# Patient Record
Sex: Female | Born: 1960 | Race: Black or African American | Hispanic: No | Smoking: Former smoker
Health system: Southern US, Community
[De-identification: ages and names within clinical notes are randomized; demographics above are authoritative.]

## PROBLEM LIST (undated history)

## (undated) DIAGNOSIS — U071 COVID-19: Secondary | ICD-10-CM

## (undated) DIAGNOSIS — D259 Leiomyoma of uterus, unspecified: Secondary | ICD-10-CM

## (undated) HISTORY — PX: OTHER SURGICAL HISTORY: SHX169

---

## 2013-05-07 ENCOUNTER — Ambulatory Visit: Payer: Self-pay

## 2013-05-19 ENCOUNTER — Ambulatory Visit: Payer: Self-pay

## 2013-05-20 LAB — PATHOLOGY REPORT

## 2013-10-31 ENCOUNTER — Ambulatory Visit: Payer: Self-pay

## 2014-08-26 ENCOUNTER — Ambulatory Visit: Payer: Self-pay | Admitting: Family Medicine

## 2018-04-30 ENCOUNTER — Other Ambulatory Visit: Payer: Self-pay

## 2018-04-30 ENCOUNTER — Emergency Department: Payer: Managed Care, Other (non HMO)

## 2018-04-30 ENCOUNTER — Emergency Department
Admission: EM | Admit: 2018-04-30 | Discharge: 2018-04-30 | Disposition: A | Discharge status: Home / Self Care | Payer: Managed Care, Other (non HMO) | Attending: Emergency Medicine | Admitting: Emergency Medicine

## 2018-04-30 ENCOUNTER — Encounter: Payer: Self-pay | Admitting: Emergency Medicine

## 2018-04-30 DIAGNOSIS — S86912A Strain of unspecified muscle(s) and tendon(s) at lower leg level, left leg, initial encounter: Secondary | ICD-10-CM

## 2018-04-30 DIAGNOSIS — Y929 Unspecified place or not applicable: Secondary | ICD-10-CM | POA: Insufficient documentation

## 2018-04-30 DIAGNOSIS — Y939 Activity, unspecified: Secondary | ICD-10-CM | POA: Insufficient documentation

## 2018-04-30 DIAGNOSIS — M25462 Effusion, left knee: Secondary | ICD-10-CM | POA: Insufficient documentation

## 2018-04-30 DIAGNOSIS — W541XXA Struck by dog, initial encounter: Secondary | ICD-10-CM | POA: Diagnosis not present

## 2018-04-30 DIAGNOSIS — S8392XA Sprain of unspecified site of left knee, initial encounter: Secondary | ICD-10-CM | POA: Diagnosis not present

## 2018-04-30 DIAGNOSIS — Z87891 Personal history of nicotine dependence: Secondary | ICD-10-CM | POA: Diagnosis not present

## 2018-04-30 DIAGNOSIS — S8992XA Unspecified injury of left lower leg, initial encounter: Secondary | ICD-10-CM | POA: Diagnosis present

## 2018-04-30 DIAGNOSIS — Y998 Other external cause status: Secondary | ICD-10-CM | POA: Insufficient documentation

## 2018-04-30 MED ORDER — TRAMADOL HCL 50 MG PO TABS
50.0000 mg | ORAL_TABLET | Freq: Once | ORAL | Status: AC
  Administered 2018-04-30: 50 mg via ORAL

## 2018-04-30 MED ORDER — HYDROCODONE-ACETAMINOPHEN 5-325 MG PO TABS
1.0000 | ORAL_TABLET | Freq: Once | ORAL | Status: DC
  Filled 2018-04-30: qty 1

## 2018-04-30 MED ORDER — TRAMADOL HCL 50 MG PO TABS
ORAL_TABLET | ORAL | Status: AC
  Filled 2018-04-30: qty 1

## 2018-04-30 MED ORDER — TRAMADOL HCL 50 MG PO TABS: 50.0000 mg | ORAL_TABLET | Freq: Two times a day (BID) | ORAL | 0 refills | Status: AC | PRN

## 2018-04-30 MED ORDER — DICLOFENAC SODIUM 50 MG PO TBEC: 50.0000 mg | DELAYED_RELEASE_TABLET | Freq: Two times a day (BID) | ORAL | 0 refills | Status: AC

## 2018-04-30 MED ORDER — CYCLOBENZAPRINE HCL 5 MG PO TABS: 5.0000 mg | ORAL_TABLET | Freq: Three times a day (TID) | ORAL | 0 refills | Status: DC | PRN

## 2018-04-30 NOTE — ED Notes (Signed)
Pt c/o left knee pain after dog jumped on her and caused her to fall. Left knee is visibly swollen, but no obvious deformities noted at this time.

## 2018-04-30 NOTE — ED Triage Notes (Signed)
PT arrived after mechanical injury causing pt to fall and injure left leg/knee. No obvious deformity

## 2018-04-30 NOTE — ED Notes (Signed)
X-ray at bedside

## 2018-04-30 NOTE — ED Provider Notes (Signed)
[facility] Emergency Department Provider Note ____________________________________________  Time seen: 40  I have reviewed the triage vital signs and the nursing notes.  HISTORY  Chief Complaint  Leg Pain  HPI Mariah Jefferson is a 57 y.o. female presents herself to the ED for evaluation of injury sustained following mechanical fall.  Patient describes her large breed [place] dog, apparently knocked her over at home this afternoon.  The dog was brought into the house, came up behind the patient, clipping her behind her legs causing her to fall backwards on her buttocks.  She describes falling onto the buttocks with the left leg flexed at the knee.  History reviewed. No pertinent past medical history.  There are no active problems to display for this patient.  History reviewed. No pertinent surgical history.  Prior to Admission medications   Medication Sig Start Date End Date Taking? Authorizing Provider  cyclobenzaprine (FLEXERIL) 5 MG tablet Take 1 tablet (5 mg total) by mouth 3 (three) times daily as needed for muscle spasms. 04/30/18   Caileb Rhue, Dannielle Karvonen, PA-C  diclofenac (VOLTAREN) 50 MG EC tablet Take 1 tablet (50 mg total) by mouth 2 (two) times daily for 15 days. 04/30/18 05/15/18  Shamaria Kavan, Dannielle Karvonen, PA-C  traMADol (ULTRAM) 50 MG tablet Take 1 tablet (50 mg total) by mouth 2 (two) times daily as needed for up to 3 days. 04/30/18 05/03/18  Murry Khiev, Dannielle Karvonen, PA-C   Allergies Penicillins  No family history on file.  Social History Social History   Tobacco Use  . Smoking status: Former Research scientist (life sciences)  . Smokeless tobacco: Never Used  Substance Use Topics  . Alcohol use: Never    Frequency: Never  . Drug use: Never    Review of Systems  Constitutional: Negative for fever. Eyes: Negative for visual changes. ENT: Negative for sore throat. Cardiovascular: Negative for chest pain. Respiratory: Negative for shortness of  breath. Gastrointestinal: Negative for abdominal pain, vomiting and diarrhea. Genitourinary: Negative for dysuria. Musculoskeletal: Negative for back pain. Left knee/leg pain as above.  Skin: Negative for rash. Neurological: Negative for headaches, focal weakness or numbness. ____________________________________________  PHYSICAL EXAM:  VITAL SIGNS: ED Triage Vitals [04/30/18 1751]  Enc Vitals Group     BP 135/78     Pulse Rate 84     Resp 18     Temp 98.3 F (36.8 C)     Temp Source Oral     SpO2 100 %     Weight 178 lb (80.7 kg)     Height 5\' 7"  (1.702 m)     Head Circumference      Peak Flow      Pain Score 10     Pain Loc      Pain Edu?      Excl. in [place]?     Constitutional: Alert and oriented. Well appearing and in no distress. Head: Normocephalic and atraumatic. Eyes: Conjunctivae are normal. Normal extraocular movements Cardiovascular: Normal rate, regular rhythm. Normal distal pulses. Respiratory: Normal respiratory effort. No wheezes/rales/rhonchi. Musculoskeletal: Left knee with moderate joint effusion noted. No obvious deformity or dislocation. Normal patella tracking with ballotment. No valgus or varus joint laxity. Patient is tender to palp to the lateral hamstring insertion. No popliteal space fullness or achilles tenderness.  She has some mild soft tissue swelling noted to the lateral aspect of the proximal calf without abrasion or laceration.  Nontender with normal range of motion in all other extremities.  Neurologic:  CN II-XII grossly intact. Normal speech and language. No gross focal neurologic deficits are appreciated. Skin:  Skin is warm, dry and intact. No rash noted. Psychiatric: Mood and affect are normal. Patient exhibits appropriate insight and judgment. ____________________________________________   RADIOLOGY  Left Knee IMPRESSION: Moderate suprapatellar joint effusion. No acute displaced fracture. Equivocal tiny avulsion along the course of the  LCL but more likely represents partial imaging of a small osteophyte off the lateral tibial plateau.  Left Tibia/Fibula IMPRESSION: No acute fracture. ____________________________________________  PROCEDURES  Procedures Ultram 50 mg PO Knee immobilizer ____________________________________________  INITIAL IMPRESSION / ASSESSMENT AND PLAN / ED COURSE  Patient with ED evaluation of injury sustained following a mechanical fall.  She is reassured by her x-rays of the knee and lower leg.  Symptoms likely represent a sprain with a moderate effusion without underlying fracture or dislocation.  Patient will be placed in a knee immobilizer for support to ambulate with weightbearing as tolerated.  She is also referred to orthopedics for ongoing symptom management.  Prescriptions for Flexeril, diclofenac, and #6 Ultram were provided for pain and inflammation relief.  A work note is provided for today as requested.  I reviewed the patient's prescription history over the last 12 months in the multi-state controlled substances database(s) that includes [place], [place], [place], [place], [place], [place], [place], [place], [place], [place], [place], [place], [place], and [place].  Results were notable for no current prescriptions.  ____________________________________________  FINAL CLINICAL IMPRESSION(S) / ED DIAGNOSES  Final diagnoses:  Knee strain, left, initial encounter  Knee effusion, left      Duha Abair, Dannielle Karvonen, PA-C 04/30/18 2112    Earleen Newport, MD 04/30/18 2228

## 2018-04-30 NOTE — Discharge Instructions (Signed)
Your exam and x-rays are consistent with a knee strain with effusion (fluid). Take th prescription meds as directed. Rst with the leg elevated and apply ice to reduce swelling. Take the prescription meds as directed. Follow-up with Dr. Jonny Ruiz for ongoing symptoms.

## 2018-12-13 ENCOUNTER — Telehealth: Payer: Self-pay | Admitting: *Deleted

## 2018-12-13 DIAGNOSIS — Z20822 Contact with and (suspected) exposure to covid-19: Secondary | ICD-10-CM

## 2018-12-13 NOTE — Telephone Encounter (Signed)
Pt with symptoms and possible exposure; no insurance, no PCP. Screened by [place], meets criteria for testing.  Pt scheduled for testing Monday 12/16/2018 at [facility] site.  Testing process reviewed, verbalizes understanding.  Pt with cough, congestion, loss of sense of smell, no fever. Directed to ED if symptoms worsen. Pt verbalizes understanding.

## 2018-12-16 ENCOUNTER — Other Ambulatory Visit: Payer: Managed Care, Other (non HMO)

## 2018-12-16 DIAGNOSIS — Z20822 Contact with and (suspected) exposure to covid-19: Secondary | ICD-10-CM

## 2018-12-18 LAB — NOVEL CORONAVIRUS, NAA: SARS-CoV-2, NAA: DETECTED — AB

## 2018-12-19 ENCOUNTER — Encounter: Payer: Self-pay | Admitting: Emergency Medicine

## 2018-12-19 ENCOUNTER — Other Ambulatory Visit: Payer: Self-pay

## 2018-12-19 ENCOUNTER — Emergency Department
Admission: EM | Admit: 2018-12-19 | Discharge: 2018-12-19 | Disposition: A | Discharge status: Home / Self Care | Payer: HRSA Program | Attending: Emergency Medicine | Admitting: Emergency Medicine

## 2018-12-19 ENCOUNTER — Emergency Department: Payer: HRSA Program

## 2018-12-19 DIAGNOSIS — U071 COVID-19: Secondary | ICD-10-CM | POA: Diagnosis not present

## 2018-12-19 DIAGNOSIS — Z87891 Personal history of nicotine dependence: Secondary | ICD-10-CM | POA: Insufficient documentation

## 2018-12-19 DIAGNOSIS — R0602 Shortness of breath: Secondary | ICD-10-CM | POA: Diagnosis present

## 2018-12-19 LAB — CBC WITH DIFFERENTIAL/PLATELET
Abs Immature Granulocytes: 0.01 10*3/uL (ref 0.00–0.07)
Basophils Absolute: 0 10*3/uL (ref 0.0–0.1)
Basophils Relative: 1 %
Eosinophils Absolute: 0 10*3/uL (ref 0.0–0.5)
Eosinophils Relative: 0 %
HCT: 39.7 % (ref 36.0–46.0)
Hemoglobin: 13.1 g/dL (ref 12.0–15.0)
Immature Granulocytes: 0 %
Lymphocytes Relative: 23 %
Lymphs Abs: 0.9 10*3/uL (ref 0.7–4.0)
MCH: 27 pg (ref 26.0–34.0)
MCHC: 33 g/dL (ref 30.0–36.0)
MCV: 81.9 fL (ref 80.0–100.0)
Monocytes Absolute: 0.3 10*3/uL (ref 0.1–1.0)
Monocytes Relative: 8 %
Neutro Abs: 2.7 10*3/uL (ref 1.7–7.7)
Neutrophils Relative %: 68 %
Platelets: 272 10*3/uL (ref 150–400)
RBC: 4.85 MIL/uL (ref 3.87–5.11)
RDW: 12.9 % (ref 11.5–15.5)
Smear Review: NORMAL
WBC: 3.9 10*3/uL — ABNORMAL LOW (ref 4.0–10.5)
nRBC: 0 % (ref 0.0–0.2)

## 2018-12-19 LAB — COMPREHENSIVE METABOLIC PANEL
ALT: 61 U/L — ABNORMAL HIGH (ref 0–44)
AST: 64 U/L — ABNORMAL HIGH (ref 15–41)
Albumin: 3.8 g/dL (ref 3.5–5.0)
Alkaline Phosphatase: 107 U/L (ref 38–126)
Anion gap: 9 (ref 5–15)
BUN: 9 mg/dL (ref 6–20)
CO2: 25 mmol/L (ref 22–32)
Calcium: 8.6 mg/dL — ABNORMAL LOW (ref 8.9–10.3)
Chloride: 100 mmol/L (ref 98–111)
Creatinine, Ser: 0.78 mg/dL (ref 0.44–1.00)
GFR calc Af Amer: >60 mL/min (ref >60)
GFR calc non Af Amer: >60 mL/min (ref >60)
Glucose, Bld: 107 mg/dL — ABNORMAL HIGH (ref 70–99)
Potassium: 3.5 mmol/L (ref 3.5–5.1)
Sodium: 134 mmol/L — ABNORMAL LOW (ref 135–145)
Total Bilirubin: 0.5 mg/dL (ref 0.3–1.2)
Total Protein: 7.6 g/dL (ref 6.5–8.1)

## 2018-12-19 LAB — SARS CORONAVIRUS 2 BY RT PCR (HOSPITAL ORDER, PERFORMED IN ~~LOC~~ HOSPITAL LAB): SARS Coronavirus 2: POSITIVE — AB

## 2018-12-19 LAB — TROPONIN I: Troponin I: <0.03 ng/mL (ref <0.03)

## 2018-12-19 MED ORDER — SODIUM CHLORIDE 0.9 % IV BOLUS
1000.0000 mL | Freq: Once | INTRAVENOUS | Status: AC
  Administered 2018-12-19: 15:00:00 1000 mL via INTRAVENOUS

## 2018-12-19 MED ORDER — ALBUTEROL SULFATE HFA 108 (90 BASE) MCG/ACT IN AERS: 2.0000 | INHALATION_SPRAY | RESPIRATORY_TRACT | 1 refills | Status: AC | PRN

## 2018-12-19 NOTE — ED Triage Notes (Signed)
Tested for covid on Monday and yesterday received call that it was positive.  Called ems for increased short of breath.  Pulse ox high 90s on RA.  Was placed on azithromycin yesterday.  Recently had shingles but they have cleared.

## 2018-12-19 NOTE — ED Provider Notes (Signed)
[facility] Emergency Department Provider Note  ____________________________________________  Time seen: Approximately 2:36 PM  I have reviewed the triage vital signs and the nursing notes.   HISTORY  Chief Complaint Shortness of Breath    HPI Mariah Jefferson is a 58 y.o. female who presents emergency department for evaluation of worsening shortness of breath and possible dehydration.  Patient reports that she has had COVID symptoms for the past 10 to 11 days.  Patient was tested 3 days ago and results returned yesterday positive for COVID-19.  Patient reports that she is experiencing cough, shortness of breath and feels like she is dehydrated with dry skin.  Patient denies any headache, neck pain or stiffness, sore throat, abdominal pain, nausea vomiting, diarrhea or constipation.  No urinary complaints.         History reviewed. No pertinent past medical history.  There are no active problems to display for this patient.   History reviewed. No pertinent surgical history.  Prior to Admission medications   Medication Sig Start Date End Date Taking? Authorizing Provider  albuterol (VENTOLIN HFA) 108 (90 Base) MCG/ACT inhaler Inhale 2 puffs into the lungs every 4 (four) hours as needed for wheezing or shortness of breath. 12/19/18   Cuthriell, Charline Bills, PA-C  cyclobenzaprine (FLEXERIL) 5 MG tablet Take 1 tablet (5 mg total) by mouth 3 (three) times daily as needed for muscle spasms. 04/30/18   Menshew, Dannielle Karvonen, PA-C    Allergies Penicillins  No family history on file.  Social History Social History   Tobacco Use  . Smoking status: Former Research scientist (life sciences)  . Smokeless tobacco: Never Used  Substance Use Topics  . Alcohol use: Never    Frequency: Never  . Drug use: Never     Review of Systems  Constitutional: No fever/chills Eyes: No visual changes. No discharge ENT: No upper respiratory complaints. Cardiovascular: no chest pain. Respiratory:  Positive cough.  Positive SOB. Gastrointestinal: No abdominal pain.  Positive nausea, no vomiting.  No diarrhea.  No constipation. Genitourinary: Negative for dysuria. No hematuria Musculoskeletal: Negative for musculoskeletal pain. Skin: Negative for rash, abrasions, lacerations, ecchymosis. Neurological: Negative for headaches, focal weakness or numbness. 10-point ROS otherwise negative.  ____________________________________________   PHYSICAL EXAM:  VITAL SIGNS: ED Triage Vitals  Enc Vitals Group     BP 12/19/18 1308 120/76     Pulse Rate 12/19/18 1308 90     Resp 12/19/18 1308 20     Temp 12/19/18 1308 98.5 F (36.9 C)     Temp Source 12/19/18 1308 Oral     SpO2 12/19/18 1308 100 %     Weight 12/19/18 1311 170 lb (77.1 kg)     Height 12/19/18 1311 5\' 10"  (1.778 m)     Head Circumference --      Peak Flow --      Pain Score 12/19/18 1311 9     Pain Loc --      Pain Edu? --      Excl. in [place]? --      Constitutional: Alert and oriented. Well appearing and in no acute distress. Eyes: Conjunctivae are normal. PERRL. EOMI. Head: Atraumatic. ENT:      Ears:       Nose: No congestion/rhinnorhea.      Mouth/Throat: Mucous membranes are moist.  Oropharynx is nonerythematous and nonedematous.  Use midline. Neck: No stridor.  Neck is supple full range of motion Hematological/Lymphatic/Immunilogical: No cervical lymphadenopathy. Cardiovascular: Normal rate, regular rhythm. Normal  S1 and S2.  Good peripheral circulation. Respiratory: Normal respiratory effort without tachypnea or retractions. Lungs with scattered expiratory wheezes bilaterally.  No inspiratory wheezing.  No rales or rhonchi.Kermit Balo air entry to the bases with no decreased or absent breath sounds. Gastrointestinal: Bowel sounds 4 quadrants. Soft and nontender to palpation. No guarding or rigidity. No palpable masses. No distention. No CVA tenderness. Musculoskeletal: Full range of motion to all extremities. No gross  deformities appreciated. Neurologic:  Normal speech and language. No gross focal neurologic deficits are appreciated.  Skin:  Skin is warm, dry and intact. No rash noted. Psychiatric: Mood and affect are normal. Speech and behavior are normal. Patient exhibits appropriate insight and judgement.   ____________________________________________   LABS (all labs ordered are listed, but only abnormal results are displayed)  Labs Reviewed  SARS CORONAVIRUS 2 ([facility]) - Abnormal; Notable for the following components:      Result Value   SARS Coronavirus 2 POSITIVE (*)    All other components within normal limits  COMPREHENSIVE METABOLIC PANEL - Abnormal; Notable for the following components:   Sodium 134 (*)    Glucose, Bld 107 (*)    Calcium 8.6 (*)    AST 64 (*)    ALT 61 (*)    All other components within normal limits  CBC WITH DIFFERENTIAL/PLATELET - Abnormal; Notable for the following components:   WBC 3.9 (*)    All other components within normal limits  TROPONIN I  URINALYSIS, COMPLETE (UACMP) WITH MICROSCOPIC   ____________________________________________  EKG   ____________________________________________  RADIOLOGY I personally viewed and evaluated these images as part of my medical decision making, as well as reviewing the written report by the radiologist.  Dg Chest 1 View  Result Date: 12/19/2018 CLINICAL DATA:  58 year old who is COVID-19 positive, presenting with shortness of breath. EXAM: Portable CHEST 1 VIEW COMPARISON:  None. FINDINGS: Suboptimal inspiration. Cardiomediastinal silhouette unremarkable for AP portable technique and degree of inspiration. Scattered peripheral patchy airspace opacities in both lungs, RIGHT greater than LEFT. No visible pleural effusions. IMPRESSION: Suboptimal inspiration. Patchy bronchopneumonia involving both lungs, RIGHT greater than LEFT, compatible with a viral pneumonia.  Electronically Signed   By: Evangeline Dakin M.D.   On: 12/19/2018 15:40    ____________________________________________    PROCEDURES  Procedure(s) performed:    Procedures    Medications  sodium chloride 0.9 % bolus 1,000 mL (1,000 mLs Intravenous New Bag/Given 12/19/18 1516)     ____________________________________________   INITIAL IMPRESSION / ASSESSMENT AND PLAN / ED COURSE  Pertinent labs & imaging results that were available during my care of the patient were reviewed by me and considered in my medical decision making (see chart for details).  Review of the [place] CSRS was performed in accordance of the [place] prior to dispensing any controlled drugs.           Patient's diagnosis is consistent with COVID-19.  Patient presented to the emergency department with increased shortness of breath, coughing, malaise and fatigue.  Patient has tested positive for COVID-19.  Symptoms are consistent with COVID.  Labs are overall reassuring.  X-ray reveals patchy bronchopneumonia consistent with viral pneumonia secondary to COVID-19.  I discussed patient's results at this point.  At this time there is no indication for admission as patient maintains good oxygen saturation without any oxygen.  Patient has been prescribed cough medication and azithromycin from her primary care.  Patient is requesting albuterol inhalers to "  help me breathe."  I feel this is reasonable and I will prescribe same for the patient.  I discussed reasons to return to the emergency department and patient verbalized understanding.  Patient states that she is comfortable going home..  Patient is given ED precautions to return to the ED for any worsening or new symptoms.     ____________________________________________  FINAL CLINICAL IMPRESSION(S) / ED DIAGNOSES  Final diagnoses:  COVID-19      NEW MEDICATIONS STARTED DURING THIS VISIT:  ED Discharge Orders         Ordered    albuterol (VENTOLIN HFA) 108  (90 Base) MCG/ACT inhaler  Every 4 hours PRN     12/19/18 1721              This chart was dictated using voice recognition software/Dragon. Despite best efforts to proofread, errors can occur which can change the meaning. Any change was purely unintentional.    Darletta Moll, PA-C 12/19/18 1734    Duffy Bruce, MD 12/20/18 [phone]

## 2018-12-19 NOTE — ED Triage Notes (Signed)
PT arrives with increased shortness of breath. Pt's  COVID 19 test came back positive yesterday. Pt also reports she feels weak, has lower back/buttocks pain, and feels nauseated. Pt states the symptoms have been present since she has been sick but are worse today. Pt requests supplemental oxygen.

## 2018-12-19 NOTE — ED Notes (Signed)
Pt taken off 2L oxygen via [place]. Pt reporting it is still hard to breath but pt verbalized understanding that it is necessary to test if she needs oxygen before discharge decision.

## 2019-09-09 ENCOUNTER — Emergency Department (HOSPITAL_COMMUNITY): Payer: BLUE CROSS/BLUE SHIELD

## 2019-09-09 ENCOUNTER — Other Ambulatory Visit: Payer: Self-pay

## 2019-09-09 ENCOUNTER — Encounter (HOSPITAL_COMMUNITY): Payer: Self-pay

## 2019-09-09 ENCOUNTER — Emergency Department (HOSPITAL_COMMUNITY)
Admission: EM | Admit: 2019-09-09 | Discharge: 2019-09-10 | Disposition: A | Discharge status: Home / Self Care | Payer: BLUE CROSS/BLUE SHIELD | Attending: Emergency Medicine | Admitting: Emergency Medicine

## 2019-09-09 DIAGNOSIS — R002 Palpitations: Secondary | ICD-10-CM | POA: Insufficient documentation

## 2019-09-09 DIAGNOSIS — R0789 Other chest pain: Secondary | ICD-10-CM | POA: Diagnosis not present

## 2019-09-09 DIAGNOSIS — Z8616 Personal history of COVID-19: Secondary | ICD-10-CM | POA: Diagnosis not present

## 2019-09-09 DIAGNOSIS — Z87891 Personal history of nicotine dependence: Secondary | ICD-10-CM | POA: Diagnosis not present

## 2019-09-09 DIAGNOSIS — R079 Chest pain, unspecified: Secondary | ICD-10-CM

## 2019-09-09 DIAGNOSIS — Z79899 Other long term (current) drug therapy: Secondary | ICD-10-CM | POA: Insufficient documentation

## 2019-09-09 HISTORY — DX: COVID-19: U07.1

## 2019-09-09 HISTORY — DX: Leiomyoma of uterus, unspecified: D25.9

## 2019-09-09 LAB — URINALYSIS, ROUTINE W REFLEX MICROSCOPIC
Bilirubin Urine: NEGATIVE
Glucose, UA: NEGATIVE mg/dL
Hgb urine dipstick: NEGATIVE
Ketones, ur: 20 mg/dL — AB
Nitrite: NEGATIVE
Protein, ur: NEGATIVE mg/dL
Specific Gravity, Urine: 1.011 (ref 1.005–1.030)
pH: 5 (ref 5.0–8.0)

## 2019-09-09 LAB — CBC
HCT: 40.9 % (ref 36.0–46.0)
Hemoglobin: 13.4 g/dL (ref 12.0–15.0)
MCH: 27.2 pg (ref 26.0–34.0)
MCHC: 32.8 g/dL (ref 30.0–36.0)
MCV: 83.1 fL (ref 80.0–100.0)
Platelets: 329 10*3/uL (ref 150–400)
RBC: 4.92 MIL/uL (ref 3.87–5.11)
RDW: 12.5 % (ref 11.5–15.5)
WBC: 5.2 10*3/uL (ref 4.0–10.5)
nRBC: 0 % (ref 0.0–0.2)

## 2019-09-09 LAB — BASIC METABOLIC PANEL
Anion gap: 10 (ref 5–15)
BUN: 11 mg/dL (ref 6–20)
CO2: 25 mmol/L (ref 22–32)
Calcium: 10 mg/dL (ref 8.9–10.3)
Chloride: 105 mmol/L (ref 98–111)
Creatinine, Ser: 0.84 mg/dL (ref 0.44–1.00)
GFR calc Af Amer: >60 mL/min (ref >60)
GFR calc non Af Amer: >60 mL/min (ref >60)
Glucose, Bld: 86 mg/dL (ref 70–99)
Potassium: 3.8 mmol/L (ref 3.5–5.1)
Sodium: 140 mmol/L (ref 135–145)

## 2019-09-09 LAB — TROPONIN I (HIGH SENSITIVITY)
Troponin I (High Sensitivity): 4 ng/L (ref <18)
Troponin I (High Sensitivity): 4 ng/L (ref <18)

## 2019-09-09 LAB — D-DIMER, QUANTITATIVE: D-Dimer, Quant: 0.63 ug/mL-FEU — ABNORMAL HIGH (ref 0.00–0.50)

## 2019-09-09 MED ORDER — SODIUM CHLORIDE (PF) 0.9 % IJ SOLN
INTRAMUSCULAR | Status: AC
  Filled 2019-09-09: qty 50

## 2019-09-09 MED ORDER — SODIUM CHLORIDE 0.9% FLUSH: 3.0000 mL | Freq: Once | INTRAVENOUS | Status: DC

## 2019-09-09 MED ORDER — IOHEXOL 350 MG/ML SOLN
80.0000 mL | Freq: Once | INTRAVENOUS | Status: AC | PRN
  Administered 2019-09-09: 80 mL via INTRAVENOUS

## 2019-09-09 NOTE — ED Provider Notes (Signed)
[facility] DEPT Provider Note   CSN: [id number] Arrival date & time: 09/09/19  1825     History Chief Complaint  Patient presents with  . Tachycardia  . Hypertension  . Chest Pain    Mariah Jefferson is a 59 y.o. female.  Presented to ER with chief complaint chest pain.  Patient states ever since she had COVID-19 last summer she has been having intermittent episodes of chest pain, shortness of breath.  Has had issues with chronic fatigue.  Over the past couple weeks she has noticed that her resting heart rate sometimes goes up to 110s on her Fitbit.  Of note she has only had Fitbit for the last few weeks.  Says that she sometimes has palpitations.  Currently not having any chest pain or palpitations.  No alleviating or aggravating factors, no triggers, not associated with exertion.  No smoking history.  No known history of coronary artery disease, no DVT/PE history, not on estrogen therapy.  HPI     Past Medical History:  Diagnosis Date  . COVID-19   . Uterine fibroids affecting pregnancy     There are no problems to display for this patient.   Past Surgical History:  Procedure Laterality Date  . cryo of the cervix    . cyst of the breast       OB History   No obstetric history on file.     History reviewed. No pertinent family history.  Social History   Tobacco Use  . Smoking status: Former Smoker    Types: Cigarettes  . Smokeless tobacco: Never Used  Substance Use Topics  . Alcohol use: Never  . Drug use: Never    Home Medications Prior to Admission medications   Medication Sig Start Date End Date Taking? Authorizing Provider  albuterol (VENTOLIN HFA) 108 (90 Base) MCG/ACT inhaler Inhale 2 puffs into the lungs every 4 (four) hours as needed for wheezing or shortness of breath. 12/19/18  Yes Cuthriell, Charline Bills, PA-C  Ascorbic Acid (VITAMIN C) 1000 MG tablet Take 1,000 mg by mouth daily.   Yes [provider]    ECHINACEA PO Take 1 capsule by mouth daily.   Yes [provider]  GARLIC OIL PO Take 1 capsule by mouth daily.   Yes [provider]  omega-3 acid ethyl esters (LOVAZA) 1 g capsule Take 1 g by mouth daily.   Yes [provider]  Pyridoxine HCl (VITAMIN B-6) 250 MG tablet Take 250 mg by mouth daily.   Yes [provider]  Turmeric Curcumin 500 MG CAPS Take 1 capsule by mouth daily.   Yes [provider]  vitamin A 3 MG (10000 UNITS) capsule Take 10,000 Units by mouth daily.   Yes [provider]  vitamin B-12 (CYANOCOBALAMIN) 100 MCG tablet Take 100 mcg by mouth daily.   Yes [provider]  vitamin E 1000 UNIT capsule Take 1,000 Units by mouth daily.   Yes [provider]  cyclobenzaprine (FLEXERIL) 5 MG tablet Take 1 tablet (5 mg total) by mouth 3 (three) times daily as needed for muscle spasms. Patient not taking: Reported on 09/09/2019 04/30/18   Menshew, Dannielle Karvonen, PA-C    Allergies    Penicillins  Review of Systems   Review of Systems  Constitutional: Positive for fatigue. Negative for chills and fever.  HENT: Negative for ear pain and sore throat.   Eyes: Negative for pain and visual disturbance.  Respiratory: Positive for  shortness of breath. Negative for cough.   Cardiovascular: Positive for chest pain and palpitations.  Gastrointestinal: Negative for abdominal pain and vomiting.  Genitourinary: Negative for dysuria and hematuria.  Musculoskeletal: Negative for arthralgias and back pain.  Skin: Negative for color change and rash.  Neurological: Negative for seizures and syncope.  All other systems reviewed and are negative.   Physical Exam Updated Vital Signs BP 124/79   Pulse 66   Temp 98.1 F (36.7 C) (Oral)   Resp 14   Ht 5\' 7"  (1.702 m)   Wt 79.4 kg   SpO2 98%   BMI 27.41 kg/m   Physical Exam Vitals and nursing note reviewed.  Constitutional:      General: She is not in acute  distress.    Appearance: She is well-developed.  HENT:     Head: Normocephalic and atraumatic.  Eyes:     Conjunctiva/sclera: Conjunctivae normal.  Cardiovascular:     Rate and Rhythm: Normal rate and regular rhythm.     Heart sounds: No murmur.  Pulmonary:     Effort: Pulmonary effort is normal. No respiratory distress.     Breath sounds: Normal breath sounds.  Abdominal:     Palpations: Abdomen is soft.     Tenderness: There is no abdominal tenderness.  Musculoskeletal:     Cervical back: Neck supple.     Right lower leg: No edema.     Left lower leg: No edema.  Skin:    General: Skin is warm and dry.     Capillary Refill: Capillary refill takes less than 2 seconds.  Neurological:     Mental Status: She is alert.     ED Results / Procedures / Treatments   Labs (all labs ordered are listed, but only abnormal results are displayed) Labs Reviewed  URINALYSIS, ROUTINE W REFLEX MICROSCOPIC - Abnormal; Notable for the following components:      Result Value   Ketones, ur 20 (*)    Leukocytes,Ua MODERATE (*)    Bacteria, UA RARE (*)    All other components within normal limits  D-DIMER, QUANTITATIVE (NOT AT [facility]) - Abnormal; Notable for the following components:   D-Dimer, Quant 0.63 (*)    All other components within normal limits  BASIC METABOLIC PANEL  CBC  TSH  TROPONIN I (HIGH SENSITIVITY)  TROPONIN I (HIGH SENSITIVITY)    EKG EKG Interpretation  Date/Time:  Tuesday September 09 2019 18:40:35 EST Ventricular Rate:  82 PR Interval:    QRS Duration: 86 QT Interval:  384 QTC Calculation: 449 R Axis:   -13 Text Interpretation: Sinus rhythm Consider right atrial enlargement LVH by voltage Confirmed by Madalyn Rob [phone]) on 09/09/2019 9:54:07 PM   Radiology DG Chest 2 View  Result Date: 09/09/2019 CLINICAL DATA:  Chest pain and shortness of breath. Tachycardia and fatigue. EXAM: CHEST - 2 VIEW COMPARISON:  12/19/2018 FINDINGS: The heart size and mediastinal  contours are within normal limits. Both lungs are clear. Thoracic spine degenerative changes noted. IMPRESSION: No active cardiopulmonary disease. Electronically Signed   By: Marlaine Hind M.D.   On: 09/09/2019 19:12   CT Angio Chest PE W and/or Wo Contrast  Result Date: 09/10/2019 CLINICAL DATA:  59 year old female with shortness of breath, chest pain and abnormal D-dimer. EXAM: CT ANGIOGRAPHY CHEST WITH CONTRAST TECHNIQUE: Multidetector CT imaging of the chest was performed using the standard protocol during bolus administration of intravenous contrast. Multiplanar CT image reconstructions and MIPs were obtained to evaluate the vascular  anatomy. CONTRAST:  50mL OMNIPAQUE IOHEXOL 350 MG/ML SOLN COMPARISON:  Chest radiographs yesterday. FINDINGS: Cardiovascular: Good contrast bolus timing in the pulmonary arterial tree. Mild lower lobe respiratory motion especially in the right lower lobe posterior basal segment. No focal filling defect identified in the pulmonary arteries to suggest acute pulmonary embolism. Cardiac size at the upper limits of normal. No pericardial effusion. Negative visible aorta. No calcified coronary artery atherosclerosis is evident. Mediastinum/Nodes: 2 cm hypodense right thyroid nodule meets size criteria for routine follow-up thyroid ultrasound. No mediastinal lymphadenopathy. Lungs/Pleura: The major airways are patent. Mildly low lung volumes. Minor atelectasis and respiratory motion. No pleural effusion or other pulmonary abnormality. Upper Abdomen: Negative visible liver, gallbladder, spleen, pancreas, adrenal glands, kidneys and bowel in the upper abdomen. Musculoskeletal: Degenerative changes in the spine. No acute osseous abnormality identified. Review of the MIP images confirms the above findings. IMPRESSION: 1. Negative for acute pulmonary embolus. No acute findings in the chest. 2. A 2 cm right thyroid nodule meets size criteria for routine follow-up Thyroid Ultrasound.  Electronically Signed   By: Genevie Ann M.D.   On: 09/10/2019 00:13    Procedures Procedures (including critical care time)  Medications Ordered in ED Medications  iohexol (OMNIPAQUE) 350 MG/ML injection 80 mL (80 mLs Intravenous Contrast Given 09/09/19 2356)    ED Course  I have reviewed the triage vital signs and the nursing notes.  Pertinent labs & imaging results that were available during my care of the patient were reviewed by me and considered in my medical decision making (see chart for details).    MDM Rules/Calculators/A&P                      26-year-old lady presenting to ER with shortness of breath, chest pain, palpitations, fatigue.  Here in ER, asymptomatic, very well-appearing, normal vital signs, remained in normal sinus rhythm at a normal rate throughout duration of visit.  Her labs were grossly within normal limits, noted mildly elevated dimer.  CTA chest negative for PE.  EKG without acute ischemic changes, normal intervals.  Troponin x2 within normal limits.  CXR negative.  Showed marked improvement when compared to her CXR over the summer while she had Covid.  Recommend that she have follow-up with her primary doctor.  Given today's work-up, believe patient is appropriate for discharge and outpatient management this time.  Patient does not have PCP, asked case management to help set up follow-up appointment outpatient.    After the discussed management above, the patient was determined to be safe for discharge.  The patient was in agreement with this plan and all questions regarding their care were answered.  ED return precautions were discussed and the patient will return to the ED with any significant worsening of condition.   Final Clinical Impression(s) / ED Diagnoses Final diagnoses:  Palpitations  Chest pain, unspecified type    Rx / DC Orders ED Discharge Orders    None       Lucrezia Starch, MD 09/10/19 1526

## 2019-09-09 NOTE — ED Notes (Signed)
Patient transported to CT 

## 2019-09-09 NOTE — ED Triage Notes (Addendum)
Patient states she wears a Fitbit and has noticed that her heart is around 116 while resting x 3 weeks. Patient also c/o fatigue and SOB.  Patient states she had chest pain earlier today and took a baby Aspirin around noon today.

## 2019-09-10 LAB — TSH: TSH: 1.738 u[IU]/mL (ref 0.350–4.500)

## 2019-09-10 NOTE — Progress Notes (Signed)
TOC CM follow up referral to schedule appt for establish with PCP. Pt scheduled appt at [facility] on 09/24/2019 at 0950 am. Updated ED provider. [place], [place] ED TOC CM [phone]

## 2019-09-10 NOTE — Discharge Instructions (Addendum)
Please follow-up with your primary doctor regarding the symptoms you are experiencing today as well as the thyroid nodule.  You will need a ultrasound of your thyroid to further evaluate this.  If your chest pain comes back, you develop difficulty in breathing or other new concerning symptom recommend return to ER for reassessment.

## 2019-09-11 ENCOUNTER — Telehealth: Payer: Self-pay | Admitting: *Deleted

## 2019-09-11 NOTE — Telephone Encounter (Signed)
TOC CM spoke to pt and provide information on appt at [facility] on 09/19/2019 at 11:20 am. Pt states this appt is closer and she plans to keep. Pt has concerns about her hospital bill and states she contacted [employer] and plan was changed and effective on 10/02/2019 were her deductible is less. Her current plan will end on 10/01/2019 Provided pt with contact number for billing. [place], [place] ED TOC CM [phone]

## 2019-09-19 ENCOUNTER — Ambulatory Visit (INDEPENDENT_AMBULATORY_CARE_PROVIDER_SITE_OTHER): Payer: BLUE CROSS/BLUE SHIELD | Admitting: Family Medicine

## 2019-09-19 ENCOUNTER — Other Ambulatory Visit: Payer: Self-pay

## 2019-09-19 ENCOUNTER — Encounter: Payer: Self-pay | Admitting: Family Medicine

## 2019-09-19 VITALS — BP 132/79 | HR 68 | Temp 97.8°F | Ht 67.0 in | Wt 173.2 lb

## 2019-09-19 DIAGNOSIS — Z636 Dependent relative needing care at home: Secondary | ICD-10-CM

## 2019-09-19 DIAGNOSIS — Z6379 Other stressful life events affecting family and household: Secondary | ICD-10-CM

## 2019-09-19 DIAGNOSIS — E041 Nontoxic single thyroid nodule: Secondary | ICD-10-CM

## 2019-09-19 NOTE — Progress Notes (Signed)
Subjective:  Patient ID: Otilio Saber, female    DOB: 1961-06-24  Age: 59 y.o. MRN: [id number]  CC:  Chief Complaint  Patient presents with  . New Patient (Initial Visit)    was seen in er at [facility] long for rapid heart beat is also wanting to follow up on this       HPI  HPI Ms Groene is a 59 year old female patient who is here today to establish care.  She has a history of COVID-19, overweight, high blood pressure.  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  She has had a lot of stress related to this.  Her mother has dementia, can only feed herself and brush her teeth.  She does everything else for her.  She has 2 sisters they are around much to help her.  They do not want her in the facility as her father died in a facility of heart failure.  She moved into her mother's house to help take care of her and their land.  She still has a house of her home but does not get to go back frequently for this.  She does have a daughter who is [redacted] weeks pregnant who helps to care for her mother if she needs anything.  But mostly she does 90% of it plus on her own.  Her mother is a very high fall risk and has a tendency to wander.  She is having quite a difficult time communicating with her sisters regarding the type of care that her mother needs and the financial responsibilities.  She is open to being seen by therapist and starting medication, but wants to take this slowly.  Of note she was seen in the emergency room because her heart rate was going up possibly related to depression and/or anxiety.  She was found to have a thyroid nodule on CT scan that needed to be followed up on ultrasound will be ordered today.  Secondary to the stress of the conversation we have opted to follow-up within 2 weeks for depression and anxiety and see how she is doing.   Today patient denies signs and symptoms  of COVID 19 infection including fever, chills, cough, shortness of breath, and headache. Past Medical, Surgical, Social History, Allergies, and Medications have been Reviewed.   Past Medical History:  Diagnosis Date  . COVID-19   . Uterine fibroids affecting pregnancy     Current Meds  Medication Sig  . albuterol (VENTOLIN HFA) 108 (90 Base) MCG/ACT inhaler Inhale 2 puffs into the lungs every 4 (four) hours as needed for wheezing or shortness of breath.  . Ascorbic Acid (VITAMIN C) 1000 MG tablet Take 1,000 mg by mouth daily.  Marland Kitchen ECHINACEA PO Take 1 capsule by mouth daily.  Marland Kitchen GARLIC OIL PO Take 1 capsule by mouth daily.  Marland Kitchen omega-3 acid ethyl esters (LOVAZA) 1 g capsule Take 1 g by mouth daily.  . Pyridoxine HCl (VITAMIN B-6) 250 MG tablet Take 250 mg by mouth daily.  . Turmeric Curcumin 500 MG CAPS Take 1 capsule by mouth daily.  . vitamin A 3 MG (10000 UNITS) capsule Take 10,000 Units by mouth daily.  . vitamin B-12 (CYANOCOBALAMIN) 100 MCG tablet Take 100 mcg by mouth daily.  . vitamin E 1000 UNIT capsule Take 1,000 Units by mouth daily.    ROS:  Review of Systems  Constitutional: Negative.   HENT: Negative.   Eyes: Negative.   Respiratory: Negative.   Cardiovascular: Negative.   Gastrointestinal: Negative.   Genitourinary: Negative.   Musculoskeletal: Negative.   Skin: Negative.   Neurological: Negative.   Endo/Heme/Allergies: Negative.   Psychiatric/Behavioral: The patient is nervous/anxious.   All other systems reviewed and are negative.   Objective:   Today's Vitals: BP 132/79 (BP Location: Right Arm, Patient Position: Sitting, Cuff Size: Normal)   Pulse 68   Temp 97.8 F (36.6 C)   Ht 5\' 7"  (1.702 m)   Wt 173 lb 3.2 oz (78.6 kg)   SpO2 100%   BMI 27.13 kg/m  Vitals with BMI 09/19/2019 09/10/2019 09/09/2019  Height 5\' 7"  - -  Weight 173 lbs 3 oz - -  BMI [id number] - -  Systolic [id number]  Diastolic 79 79 85  Pulse 68 66 70     Physical Exam Vitals and  nursing note reviewed.  Constitutional:      Appearance: Normal appearance. She is well-developed, well-groomed and overweight.  HENT:     Head: Normocephalic and atraumatic.     Right Ear: External ear normal.     Left Ear: External ear normal.     Mouth/Throat:     Comments: Mask in place  Eyes:     General:        Right eye: No discharge.        Left eye: No discharge.     Conjunctiva/sclera: Conjunctivae normal.  Cardiovascular:     Rate and Rhythm: Normal rate and regular rhythm.     Pulses: Normal pulses.     Heart sounds: Normal heart sounds.  Pulmonary:     Effort: Pulmonary effort is normal.     Breath sounds: Normal breath sounds.  Musculoskeletal:        General: Normal range of motion.     Cervical back: Normal range of motion and neck supple.  Skin:    General: Skin is warm.  Neurological:     General: No focal deficit present.     Mental Status: She is alert and oriented to person, place, and time.  Psychiatric:        Attention and Perception: Attention normal.        Mood and Affect: Mood is anxious. Affect is tearful.        Speech: Speech is rapid and pressured.        Behavior: Behavior is agitated. Behavior is cooperative.        Thought Content: Thought content normal.        Cognition and Memory: Cognition normal.        Judgment: Judgment normal.     Assessment   1. Thyroid nodule   2. Caregiver burden   3. Stress due to illness of family member     Tests ordered Orders Placed This Encounter  Procedures  . US THYROID  . Ambulatory referral to [facility]: Please see assessment and plan per problem list above.   No orders of the defined types were placed in this encounter.   Patient to follow-up in 6 months .  Perlie Mayo, NP

## 2019-09-19 NOTE — Patient Instructions (Addendum)
I appreciate the opportunity to provide you with care for your health and wellness. Today we discussed: establish care   Follow up: 2 weeks   No labs or referrals today  Please start taking 5 minutes daily away from anything to deep breath and center yourself as best you can. Please take time to make a list of things that would make your life better.  Please continue to practice social distancing to keep you, your family, and our community safe.  If you must go out, please wear a mask and practice good handwashing.  It was a pleasure to see you and I look forward to continuing to work together on your health and well-being. Please do not hesitate to call the office if you need care or have questions about your care.  Have a wonderful day and week. With Gratitude, Cherly Beach, DNP, AGNP-BC   Dementia Caregiver Guide Dementia is a term used to describe a number of symptoms that affect memory and thinking. The most common symptoms include:  Memory loss.  Trouble with language and communication.  Trouble concentrating.  Poor judgment.  Problems with reasoning.  Child-like behavior and language.  Extreme anxiety.  Angry outbursts.  Wandering from home or public places. Dementia usually gets worse slowly over time. In the early stages, people with dementia can stay independent and safe with some help. In later stages, they need help with daily tasks such as dressing, grooming, and using the bathroom. How to help the person with dementia cope Dementia can be frightening and confusing. Here are some tips to help the person with dementia cope with changes caused by the disease. General tips  Keep the person on track with his or her routine.  Try to identify areas where the person may need help.  Be supportive, patient, calm, and encouraging.  Gently remind the person that adjusting to changes takes time.  Help with the tasks that the person has asked for help  with.  Keep the person involved in daily tasks and decisions as much as possible.  Encourage conversation, but try not to get frustrated or harried if the person struggles to find words or does not seem to appreciate your help. Communication tips  When the person is talking or seems frustrated, make eye contact and hold the person's hand.  Ask specific questions that need yes or no answers.  Use simple words, short sentences, and a calm voice. Only give one direction at a time.  When offering choices, limit them to just 1 or 2.  Avoid correcting the person in a negative way.  If the person is struggling to find the right words, gently try to help him or her. How to recognize symptoms of stress Symptoms of stress in caregivers include:  Feeling frustrated or angry with the person with dementia.  Denying that the person has dementia or that his or her symptoms will not improve.  Feeling hopeless and unappreciated.  Difficulty sleeping.  Difficulty concentrating.  Feeling anxious, irritable, or depressed.  Developing stress-related health problems.  Feeling like you have too little time for your own life. Follow these instructions at home:   Make sure that you and the person you are caring for: ? Get regular sleep. ? Exercise regularly. ? Eat regular, nutritious meals. ? Drink enough fluid to keep your urine clear or pale yellow. ? Take over-the-counter and prescription medicines only as told by your health care providers. ? Attend all scheduled health care appointments.  Join  a support group with others who are caregivers.  Ask about respite care resources so that you can have a regular break from the stress of caregiving.  Look for signs of stress in yourself and in the person you are caring for. If you notice signs of stress, take steps to manage it.  Consider any safety risks and take steps to avoid them.  Organize medications in a pill box for each day of the  week.  Create a plan to handle any legal or financial matters. Get legal or financial advice if needed.  Keep a calendar in a central location to remind the person of appointments or other activities. Tips for reducing the risk of injury  Keep floors clear of clutter. Remove rugs, magazine racks, and floor lamps.  Keep hallways well lit, especially at night.  Put a handrail and nonslip mat in the bathtub or shower.  Put childproof locks on cabinets that contain dangerous items, such as medicines, alcohol, guns, toxic cleaning items, sharp tools or utensils, matches, and lighters.  Put the locks in places where the person cannot see or reach them easily. This will help ensure that the person does not wander out of the house and get lost.  Be prepared for emergencies. Keep a list of emergency phone numbers and addresses in a convenient area.  Remove car keys and lock garage doors so that the person does not try to get in the car and drive.  Have the person wear a bracelet that tracks locations and identifies the person as having memory problems. This should be worn at all times for safety. Where to find support: Many individuals and organizations offer support. These include:  Support groups for people with dementia and for caregivers.  Counselors or therapists.  Home health care services.  Adult day care centers. Where to find more information Alzheimer's Association: [employer] Contact a health care provider if:  The person's health is rapidly getting worse.  You are no longer able to care for the person.  Caring for the person is affecting your physical and emotional health.  The person threatens himself or herself, you, or anyone else. Summary  Dementia is a term used to describe a number of symptoms that affect memory and thinking.  Dementia usually gets worse slowly over time.  Take steps to reduce the person's risk of injury, and to plan for future  care.  Caregivers need support, relief from caregiving, and time for their own lives. This information is not intended to replace advice given to you by your health care provider. Make sure you discuss any questions you have with your health care provider. Document Revised: 06/01/2017 Document Reviewed: 05/23/2016 Elsevier Patient Education  2020 [employer].

## 2019-09-23 DIAGNOSIS — E041 Nontoxic single thyroid nodule: Secondary | ICD-10-CM | POA: Insufficient documentation

## 2019-09-23 DIAGNOSIS — Z636 Dependent relative needing care at home: Secondary | ICD-10-CM | POA: Insufficient documentation

## 2019-09-23 DIAGNOSIS — Z6379 Other stressful life events affecting family and household: Secondary | ICD-10-CM | POA: Insufficient documentation

## 2019-09-23 NOTE — Assessment & Plan Note (Signed)
Following up with ultrasound

## 2019-09-23 NOTE — Assessment & Plan Note (Signed)
Close follow-up 2 weeks, referral to behavioral health made.  Possible need for starting the medication.  Denies having any SI or HI.  Denies having any depression predominantly anxious she reports.

## 2019-09-24 ENCOUNTER — Inpatient Hospital Stay: Payer: BLUE CROSS/BLUE SHIELD

## 2019-09-30 ENCOUNTER — Ambulatory Visit (HOSPITAL_COMMUNITY): Payer: BLUE CROSS/BLUE SHIELD

## 2019-10-07 ENCOUNTER — Other Ambulatory Visit: Payer: Self-pay

## 2019-10-07 ENCOUNTER — Ambulatory Visit (HOSPITAL_COMMUNITY)
Admission: RE | Admit: 2019-10-07 | Discharge: 2019-10-07 | Disposition: A | Discharge status: Home / Self Care | Payer: BLUE CROSS/BLUE SHIELD | Source: Ambulatory Visit | Attending: Family Medicine | Admitting: Family Medicine

## 2019-10-07 DIAGNOSIS — E041 Nontoxic single thyroid nodule: Secondary | ICD-10-CM | POA: Insufficient documentation

## 2019-10-08 ENCOUNTER — Encounter: Payer: Self-pay | Admitting: Family Medicine

## 2019-10-08 ENCOUNTER — Other Ambulatory Visit: Payer: Self-pay | Admitting: Family Medicine

## 2019-10-08 ENCOUNTER — Ambulatory Visit (INDEPENDENT_AMBULATORY_CARE_PROVIDER_SITE_OTHER): Payer: BLUE CROSS/BLUE SHIELD | Admitting: Family Medicine

## 2019-10-08 VITALS — BP 114/78 | HR 85 | Temp 97.5°F | Resp 15 | Ht 67.0 in | Wt 172.4 lb

## 2019-10-08 DIAGNOSIS — E041 Nontoxic single thyroid nodule: Secondary | ICD-10-CM

## 2019-10-08 DIAGNOSIS — Z6379 Other stressful life events affecting family and household: Secondary | ICD-10-CM

## 2019-10-08 NOTE — Assessment & Plan Note (Signed)
Following up from ultrasound biopsy needed correlating with CT scan finding.  Will be ordering this today.

## 2019-10-08 NOTE — Assessment & Plan Note (Addendum)
Is open to going to behavioral health therapy.  Referral made again. Denies having any SI or HI.  Is definitely doing much better now that she has had some time for self.  Does need to talk with somebody to help figure out what neck steps will be.

## 2019-10-08 NOTE — Patient Instructions (Signed)
I appreciate the opportunity to provide you with care for your health and wellness. Today we discussed: thyroid and other life events  Follow up: 2 weeks post thyroid biopsy   No labs  Referral today for therapy  HAPPY SPRING :)   Keep your list close by. Spend 10 mins daily doing something you love. Take photos of the path and outdoor areas to show me at next visit!  Thank you for showing up for yourself. Thank you for advocating for your mom. Thank your neighbor for his blessings to help you in your yard.  Each day is a new day to continue.  Please continue to practice social distancing to keep you, your family, and our community safe.  If you must go out, please wear a mask and practice good handwashing.  It was a pleasure to see you and I look forward to continuing to work together on your health and well-being. Please do not hesitate to call the office if you need care or have questions about your care.  Have a wonderful day and week. With Gratitude, Cherly Beach, DNP, AGNP-BC

## 2019-10-08 NOTE — Progress Notes (Signed)
Subjective:  Patient ID: Mariah Jefferson, female    DOB: 16-Dec-1960  Age: 59 y.o. MRN: [id number]  CC:  Chief Complaint  Patient presents with  . Follow-up    follow up on US thyroid       HPI  HPI   Mariah Jefferson is a 59 year old female patient presents today for follow-up secondary to having thyroid biopsy.  Findings suggest need for fine-needle biopsy.  She is tearful about this and concerned but is open to having the procedure done.  Thyroid biopsy:  3.8 cm right inferior TR 4 nodule meets criteria for biopsy as above. This nodule correlates with the CT finding.  Additionally previous visit a lot of caregiver role strain and family distress were noted at established visit see note Reports that her mother went to the hospital last Tuesday, March 30 and yesterday on April 6 was transferred to [facility] for rehab.  She was treating her mother at home with a antibiotic for UTI but she was getting pressure ulcers and was very somnolent so she decided to take her to the hospital. She reports that her sisters have had a difficult time and communicated about this.  As noted in our conversation and in the conversation with the doctors she was doing what was best by her.    She has been working on things that we talked about spending time daily doing things that she needs to help herself get into a better spot should her mother come back home. Thankfully she had some wonderful neighbors come to help her 1 the [address] [place].  She reports that she has felt better especially in the last week being able to take care of herself and noted her mom is being taken care of as well.  She is tearful about the situation but knows that she is doing right by her mother and she feels good and in her conscious for this.  Today patient denies signs and symptoms of COVID 19 infection including fever, chills, cough, shortness of breath, and headache. Past Medical, Surgical, Social History, Allergies, and  Medications have been Reviewed.   Past Medical History:  Diagnosis Date  . COVID-19   . Uterine fibroids affecting pregnancy     Current Meds  Medication Sig  . albuterol (VENTOLIN HFA) 108 (90 Base) MCG/ACT inhaler Inhale 2 puffs into the lungs every 4 (four) hours as needed for wheezing or shortness of breath.  . Ascorbic Acid (VITAMIN C) 1000 MG tablet Take 1,000 mg by mouth daily.  Marland Kitchen ECHINACEA PO Take 1 capsule by mouth daily.  Marland Kitchen GARLIC OIL PO Take 1 capsule by mouth daily.  Marland Kitchen omega-3 acid ethyl esters (LOVAZA) 1 g capsule Take 1 g by mouth daily.  . Pyridoxine HCl (VITAMIN B-6) 250 MG tablet Take 250 mg by mouth daily.  . Turmeric Curcumin 500 MG CAPS Take 1 capsule by mouth daily.  . vitamin A 3 MG (10000 UNITS) capsule Take 10,000 Units by mouth daily.  . vitamin B-12 (CYANOCOBALAMIN) 100 MCG tablet Take 100 mcg by mouth daily.  . vitamin E 1000 UNIT capsule Take 1,000 Units by mouth daily.    ROS:  Review of Systems  Constitutional: Negative.   HENT: Negative.   Eyes: Negative.   Respiratory: Negative.   Cardiovascular: Negative.   Gastrointestinal: Negative.   Genitourinary: Negative.   Musculoskeletal: Negative.   Skin: Negative.   Neurological: Negative.   Endo/Heme/Allergies: Negative.   Psychiatric/Behavioral: Negative.  All other systems reviewed and are negative.    Objective:   Today's Vitals: BP 114/78   Pulse 85   Temp (!) 97.5 F (36.4 C) (Temporal)   Resp 15   Ht 5\' 7"  (1.702 m)   Wt 172 lb 6.4 oz (78.2 kg)   SpO2 99%   BMI 27.00 kg/m  Vitals with BMI 10/08/2019 09/19/2019 09/10/2019  Height 5\' 7"  5\' 7"  -  Weight 172 lbs 6 oz 173 lbs 3 oz -  BMI 27 [id number] -  Systolic [id number]  Diastolic 78 79 79  Pulse 85 68 66     Physical Exam Vitals and nursing note reviewed.  Constitutional:      Appearance: Normal appearance. She is well-developed, well-groomed and overweight.  HENT:     Head: Normocephalic and atraumatic.     Right Ear:  External ear normal.     Left Ear: External ear normal.     Nose: Nose normal.     Mouth/Throat:     Mouth: Mucous membranes are moist.     Pharynx: Oropharynx is clear.  Eyes:     General:        Right eye: No discharge.        Left eye: No discharge.     Conjunctiva/sclera: Conjunctivae normal.  Cardiovascular:     Rate and Rhythm: Normal rate and regular rhythm.     Pulses: Normal pulses.     Heart sounds: Normal heart sounds.  Pulmonary:     Effort: Pulmonary effort is normal.     Breath sounds: Normal breath sounds.  Musculoskeletal:        General: Normal range of motion.     Cervical back: Normal range of motion and neck supple.  Skin:    General: Skin is warm.  Neurological:     General: No focal deficit present.     Mental Status: She is alert and oriented to person, place, and time.  Psychiatric:        Attention and Perception: Attention normal.        Mood and Affect: Mood normal. Affect is tearful.        Speech: Speech normal.        Behavior: Behavior normal. Behavior is cooperative.        Thought Content: Thought content normal.        Cognition and Memory: Cognition normal.        Judgment: Judgment normal.     Comments: Noted improvement from previous visit anxiety.  Speech not as pressured.  More tearful affect today.     Assessment   1. Thyroid nodule   2. Stress due to illness of family member     Tests ordered Orders Placed This Encounter  Procedures  . Ambulatory referral to [facility]: Please see assessment and plan per problem list above.   No orders of the defined types were placed in this encounter.   Patient to follow-up in 2-3 weeks .  Perlie Mayo, NP

## 2019-10-09 ENCOUNTER — Ambulatory Visit: Payer: BLUE CROSS/BLUE SHIELD | Admitting: Family Medicine

## 2019-10-09 ENCOUNTER — Other Ambulatory Visit (HOSPITAL_COMMUNITY)
Admission: RE | Admit: 2019-10-09 | Discharge: 2019-10-09 | Disposition: A | Discharge status: Home / Self Care | Payer: BLUE CROSS/BLUE SHIELD | Source: Ambulatory Visit | Attending: Family Medicine | Admitting: Family Medicine

## 2019-10-09 ENCOUNTER — Ambulatory Visit
Admission: RE | Admit: 2019-10-09 | Discharge: 2019-10-09 | Disposition: A | Discharge status: Home / Self Care | Payer: BLUE CROSS/BLUE SHIELD | Source: Ambulatory Visit | Attending: Family Medicine | Admitting: Family Medicine

## 2019-10-09 DIAGNOSIS — E041 Nontoxic single thyroid nodule: Secondary | ICD-10-CM

## 2019-10-10 ENCOUNTER — Ambulatory Visit: Payer: BLUE CROSS/BLUE SHIELD | Admitting: Family Medicine

## 2019-10-10 LAB — CYTOLOGY - NON PAP

## 2019-10-24 ENCOUNTER — Encounter: Payer: Self-pay | Admitting: Family Medicine

## 2019-10-24 ENCOUNTER — Other Ambulatory Visit: Payer: Self-pay

## 2019-10-24 ENCOUNTER — Telehealth (INDEPENDENT_AMBULATORY_CARE_PROVIDER_SITE_OTHER): Payer: BLUE CROSS/BLUE SHIELD | Admitting: Family Medicine

## 2019-10-24 VITALS — BP 126/83 | HR 70 | Temp 97.8°F | Ht 67.0 in | Wt 171.8 lb

## 2019-10-24 DIAGNOSIS — Z636 Dependent relative needing care at home: Secondary | ICD-10-CM

## 2019-10-24 DIAGNOSIS — E041 Nontoxic single thyroid nodule: Secondary | ICD-10-CM

## 2019-10-24 NOTE — Assessment & Plan Note (Signed)
Negative afirma results. Will follow as needed.

## 2019-10-24 NOTE — Patient Instructions (Addendum)
I appreciate the opportunity to provide you with care for your health and wellness. Today we discussed: mom's care and thyroid bx  Follow up: 8 weeks  No labs or referrals today  Please take time for yourself this weekend. Read your prayer book, spend time outside in your favorite spot.  Please continue to practice social distancing to keep you, your family, and our community safe.  If you must go out, please wear a mask and practice good handwashing.  It was a pleasure to see you and I look forward to continuing to work together on your health and well-being. Please do not hesitate to call the office if you need care or have questions about your care.  Have a wonderful day and week. With Gratitude, Cherly Beach, DNP, AGNP-BC

## 2019-10-24 NOTE — Progress Notes (Signed)
Subjective:  Patient ID: Mariah Jefferson, female    DOB: [birth year]  Age: 59 y.o. MRN: [id number]  CC:  Chief Complaint  Patient presents with  . Follow-up    2 weeks post thyroid biopsy      HPI  HPI Mariah Jefferson is a 59 year old female patient of mine.  Previously establish care with me back in March.  Presents here for follow-up 2 weeks post thyroid biopsy.  Afirma results are negative for it being cancerous.  We will follow-up as needed.  She is happy with these results and very thankful to hear them.  Additionally she reports that her mother is been asked in place by insurance is about to end.  She is having a difficult time with her sister trying to get this situation handled as she does not feel like it is good for her mother to come home as she requires so much extra care.  But her sister feels that she is not doing right by her mother because her mother never went to going to a facility.  And wanted to be at home.  She has not had a goals of care discussion with anyone in the facility it sounds like.  And she is not doing good self-care for herself at this time.  I have sent the referral for behavioral health therapy and there has not been any follow-up as of the right now.  She still declines taking any medications at this time even though she says she is not sleeping very well.  She is extremely pressured with her speech and anxious as she was the first day I met her.  She denies having any other concerns or issues at this time physically only having to deal with the emotional distress that she reports.  Today patient denies signs and symptoms of COVID 19 infection including fever, chills, cough, shortness of breath, and headache. Past Medical, Surgical, Social History, Allergies, and Medications have been Reviewed.   Past Medical History:  Diagnosis Date  . COVID-19   . Uterine fibroids affecting pregnancy     Current Meds  Medication Sig  . albuterol (VENTOLIN HFA)  108 (90 Base) MCG/ACT inhaler Inhale 2 puffs into the lungs every 4 (four) hours as needed for wheezing or shortness of breath.  . Ascorbic Acid (VITAMIN C) 1000 MG tablet Take 1,000 mg by mouth daily.  Marland Kitchen ECHINACEA PO Take 1 capsule by mouth daily.  Marland Kitchen GARLIC OIL PO Take 1 capsule by mouth daily.  Marland Kitchen omega-3 acid ethyl esters (LOVAZA) 1 g capsule Take 1 g by mouth daily.  . Pyridoxine HCl (VITAMIN B-6) 250 MG tablet Take 250 mg by mouth daily.  . Turmeric Curcumin 500 MG CAPS Take 1 capsule by mouth daily.  . vitamin A 3 MG (10000 UNITS) capsule Take 10,000 Units by mouth daily.  . vitamin B-12 (CYANOCOBALAMIN) 100 MCG tablet Take 100 mcg by mouth daily.  . vitamin E 1000 UNIT capsule Take 1,000 Units by mouth daily.    ROS:  Review of Systems  Constitutional: Negative.   HENT: Negative.   Eyes: Negative.   Respiratory: Negative.   Cardiovascular: Negative.   Gastrointestinal: Negative.   Genitourinary: Negative.   Musculoskeletal: Negative.   Skin: Negative.   Neurological: Negative.   Endo/Heme/Allergies: Negative.   Psychiatric/Behavioral: Negative.   All other systems reviewed and are negative.    Objective:   Today's Vitals: BP 126/83 (BP Location: Right Arm, Patient Position: Sitting, Cuff  Size: Normal)   Pulse 70   Temp 97.8 F (36.6 C) (Temporal)   Ht '5\' 7"'  (1.702 m)   Wt 171 lb 12.8 oz (77.9 kg)   SpO2 99%   BMI 26.91 kg/m  Vitals with BMI 10/24/2019 10/08/2019 09/19/2019  Height '5\' 7"'  '5\' 7"'  '5\' 7"'   Weight 171 lbs 13 oz 172 lbs 6 oz 173 lbs 3 oz  BMI 94.4 27 96.75  Systolic 916 384 665  Diastolic 83 78 79  Pulse 70 85 68     Physical Exam Vitals and nursing note reviewed.  Constitutional:      Appearance: Normal appearance. She is well-developed, well-groomed and overweight.  HENT:     Head: Normocephalic and atraumatic.     Right Ear: External ear normal.     Left Ear: External ear normal.     Mouth/Throat:     Comments: Mask in place  Eyes:      General:        Right eye: No discharge.        Left eye: No discharge.     Conjunctiva/sclera: Conjunctivae normal.  Cardiovascular:     Rate and Rhythm: Normal rate and regular rhythm.     Pulses: Normal pulses.     Heart sounds: Normal heart sounds.  Pulmonary:     Effort: Pulmonary effort is normal.     Breath sounds: Normal breath sounds.  Musculoskeletal:        General: Normal range of motion.     Cervical back: Normal range of motion and neck supple.  Skin:    General: Skin is warm.  Neurological:     General: No focal deficit present.     Mental Status: She is alert and oriented to person, place, and time.  Psychiatric:        Attention and Perception: Attention normal.        Mood and Affect: Mood is anxious. Affect is tearful.        Speech: Speech is rapid and pressured.        Behavior: Behavior is agitated and withdrawn. Behavior is cooperative.        Thought Content: Thought content normal.        Cognition and Memory: Cognition normal.        Judgment: Judgment normal.     Assessment   1. Thyroid nodule   2. Caregiver burden     Tests ordered No orders of the defined types were placed in this encounter.   Plan: Please see assessment and plan per problem list above.   No orders of the defined types were placed in this encounter.   Patient to follow-up in 3 months  Perlie Mayo, NP

## 2019-10-24 NOTE — Assessment & Plan Note (Signed)
Referral to therapy was followed up on. Strongly encouraged her to get the appt soon. Additionally advise a goals of care discussion with the rehab facility for her mom. Also discussed self care

## 2019-10-30 ENCOUNTER — Encounter (HOSPITAL_COMMUNITY): Payer: Self-pay

## 2019-11-12 ENCOUNTER — Telehealth (INDEPENDENT_AMBULATORY_CARE_PROVIDER_SITE_OTHER): Payer: Self-pay | Admitting: Licensed Clinical Social Worker

## 2019-11-12 DIAGNOSIS — Z6379 Other stressful life events affecting family and household: Secondary | ICD-10-CM

## 2019-11-12 NOTE — Telephone Encounter (Signed)
Therapist contacted Vaughan Basta on 2 occassions to initiate [place] services. Contacted on this day and on Monday Nov 10 2019

## 2019-11-19 ENCOUNTER — Telehealth (INDEPENDENT_AMBULATORY_CARE_PROVIDER_SITE_OTHER): Payer: Self-pay | Admitting: Licensed Clinical Social Worker

## 2019-11-19 DIAGNOSIS — Z6379 Other stressful life events affecting family and household: Secondary | ICD-10-CM

## 2019-11-19 NOTE — Telephone Encounter (Signed)
Left message on 11/17/2019 & 11/19/2019 encouraging contact

## 2019-12-25 ENCOUNTER — Ambulatory Visit: Payer: BLUE CROSS/BLUE SHIELD | Admitting: Family Medicine

## 2020-01-08 ENCOUNTER — Telehealth (HOSPITAL_COMMUNITY): Payer: Self-pay | Admitting: Licensed Clinical Social Worker

## 2020-01-08 NOTE — Telephone Encounter (Signed)
Mailbox full unable to leave message.

## 2020-01-14 ENCOUNTER — Telehealth: Payer: Self-pay | Admitting: Licensed Clinical Social Worker

## 2020-01-14 ENCOUNTER — Telehealth (HOSPITAL_COMMUNITY): Payer: Self-pay | Admitting: Clinical

## 2020-01-14 NOTE — Telephone Encounter (Signed)
Patient stated that she does not want "over the phone" therapy.  She stated that she will wait on an "in-person visit".

## 2020-01-14 NOTE — Telephone Encounter (Signed)
Called patient x2 with no answer, unable to leave voice message due to voicemail box being full.

## 2020-01-14 NOTE — Telephone Encounter (Signed)
[facility] call attempted (Voicemail full; unable to leave message).

## 2020-03-01 ENCOUNTER — Telehealth (HOSPITAL_COMMUNITY): Payer: Self-pay | Admitting: Physical Therapy

## 2020-03-01 NOTE — Telephone Encounter (Signed)
S/w Daughter she will contact MD and request Wound Referral from her Mother's MD. She will have MD fax referral and understand once triage we will call to make an apptment

## 2021-03-22 ENCOUNTER — Encounter: Payer: Self-pay | Admitting: *Deleted

## 2021-04-19 ENCOUNTER — Ambulatory Visit: Payer: 59

## 2021-05-02 ENCOUNTER — Other Ambulatory Visit: Payer: Self-pay

## 2021-05-02 ENCOUNTER — Ambulatory Visit: Payer: Self-pay

## 2021-05-02 ENCOUNTER — Telehealth: Payer: Self-pay | Admitting: *Deleted

## 2021-05-02 NOTE — Telephone Encounter (Signed)
Tried to call pt for nurse visit by phone.  Number not in service.  Called alternate number on file and left message for pt to call us back.

## 2021-12-06 IMAGING — CR DG CHEST 2V
2 series · 2 of 2 positions shown · non-contrast
Comparison: 12/19/2018

CLINICAL DATA: Chest pain and shortness of breath. Tachycardia and
fatigue.

EXAM:
CHEST - 2 VIEW

[w chest pa]
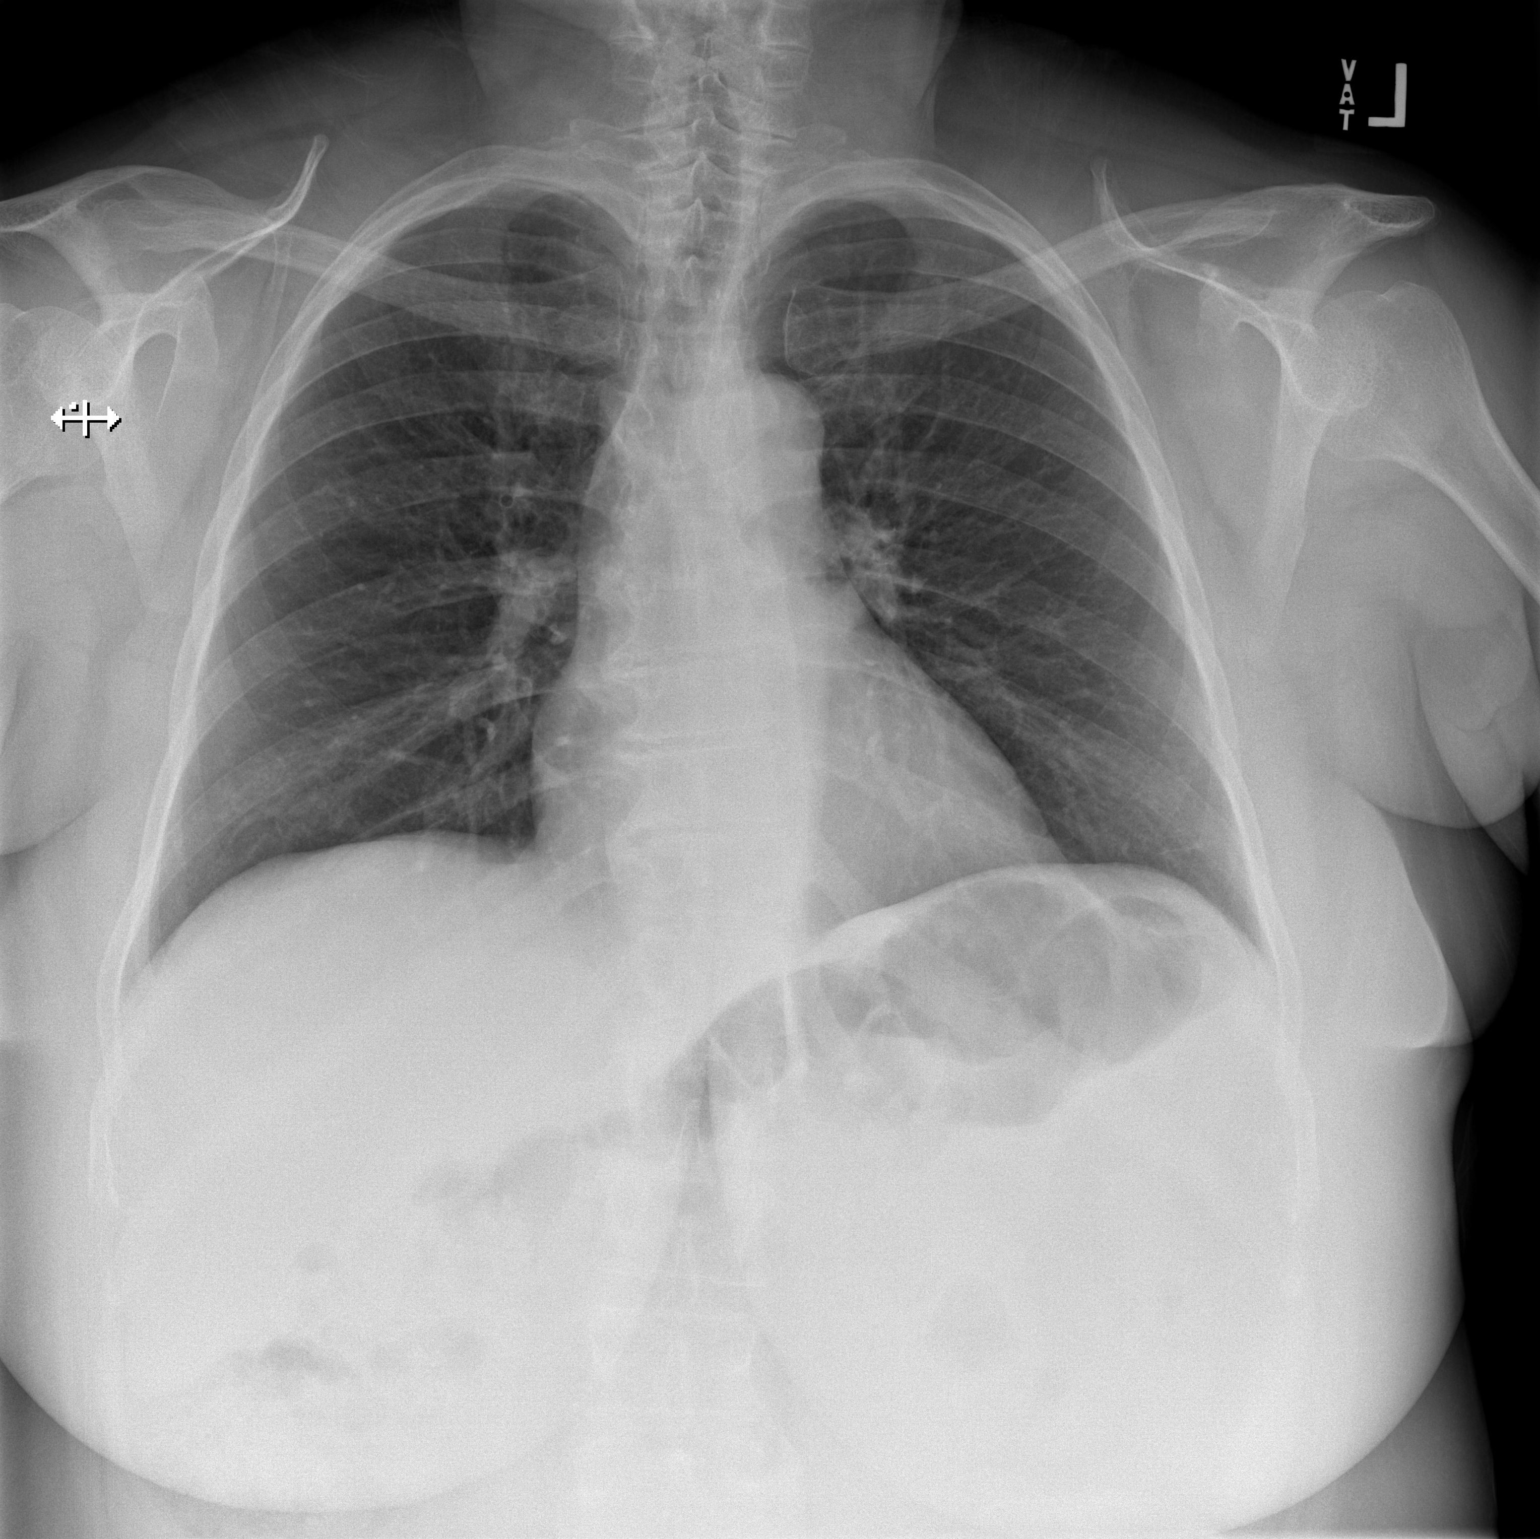

[w chest lat]
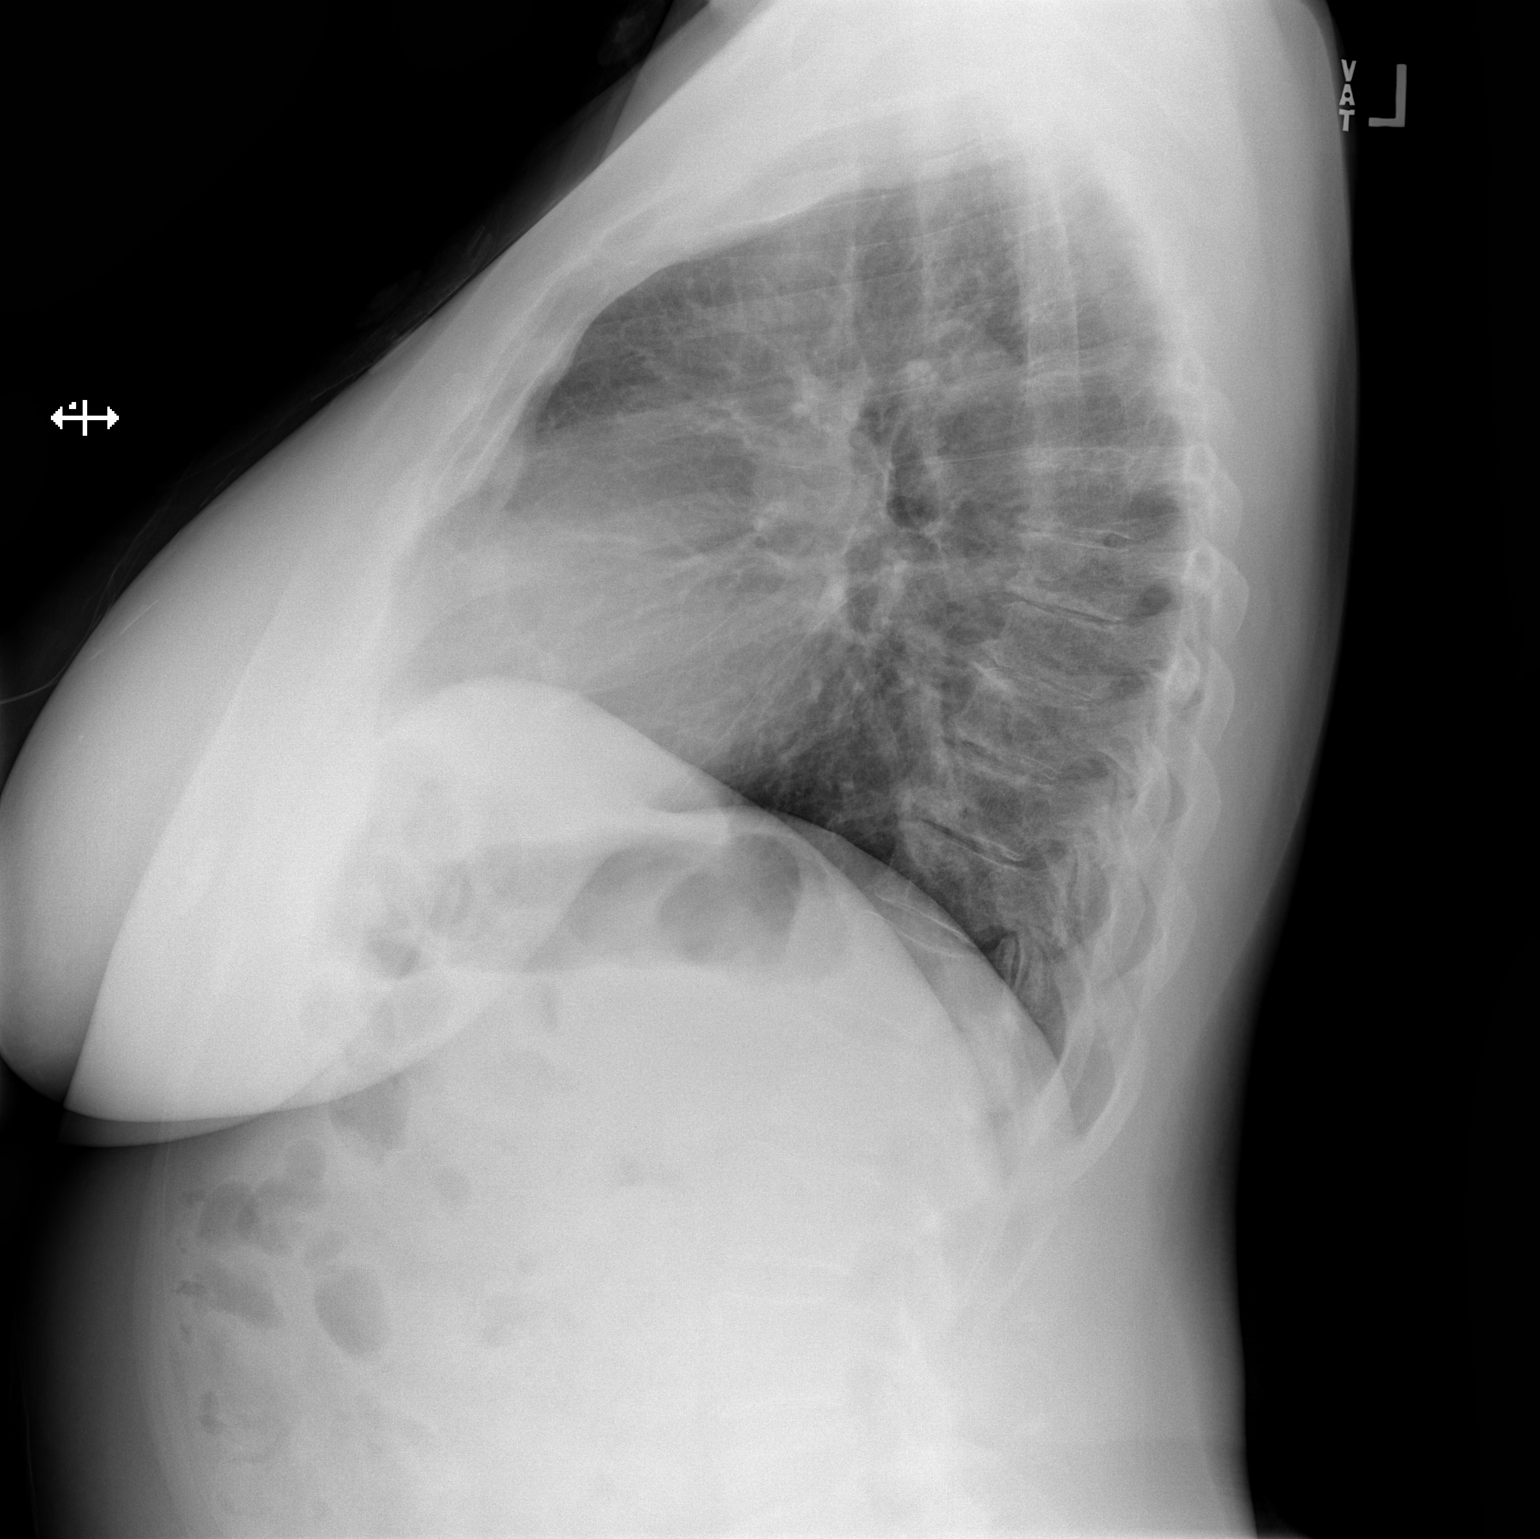

[2 of 2 positions shown; findings below may reference images not displayed]

FINDINGS: The heart size and mediastinal contours are within normal limits.
Both lungs are clear. Thoracic spine degenerative changes noted.
IMPRESSION: No active cardiopulmonary disease.

## 2021-12-06 IMAGING — CT CT ANGIO CHEST
2 of 7 series · 18 of 46 positions shown · IV contrast (OMNIPAQUE)
Comparison: Chest radiographs yesterday.

CLINICAL DATA: 58-year-old female with shortness of breath, chest
pain and abnormal D-dimer.

EXAM:
CT ANGIOGRAPHY CHEST WITH CONTRAST
TECHNIQUE: Multidetector CT imaging of the chest was performed using the
standard protocol during bolus administration of intravenous
contrast. Multiplanar CT image reconstructions and MIPs were
obtained to evaluate the vascular anatomy.
CONTRAST:  80mL OMNIPAQUE IOHEXOL 350 MG/ML SOLN

[Series 5: thins · axial · 0.78mm/px · z∈[-270,-54]mm · 16 of 246 slices shown]
[im 15/246  lung]
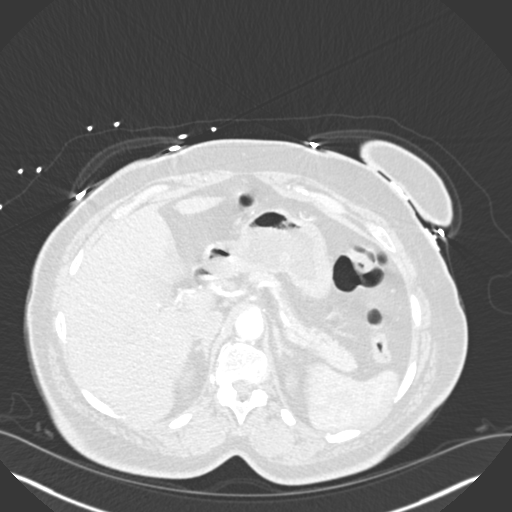
[im 29/246  soft-tissue]
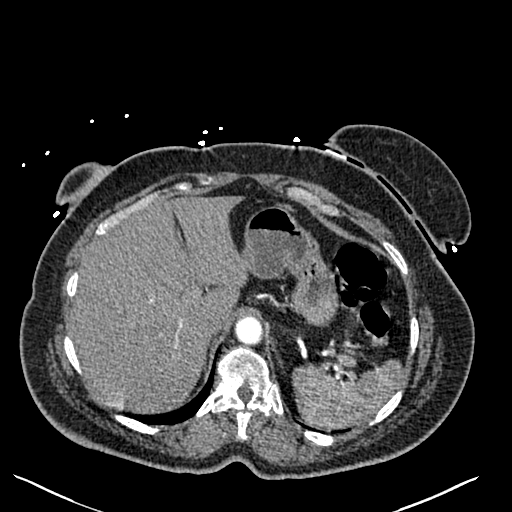
[im 44/246  lung]
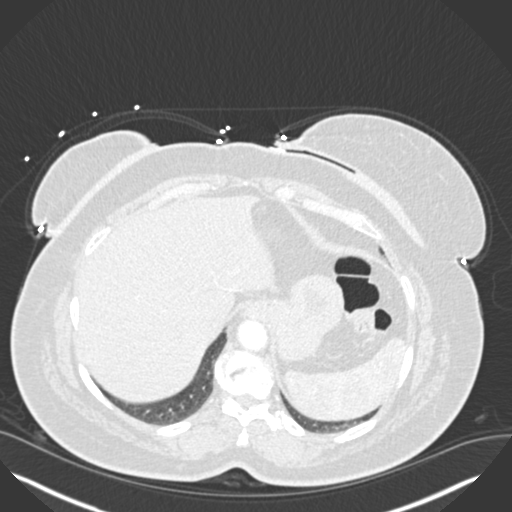
[im 58/246  soft-tissue]
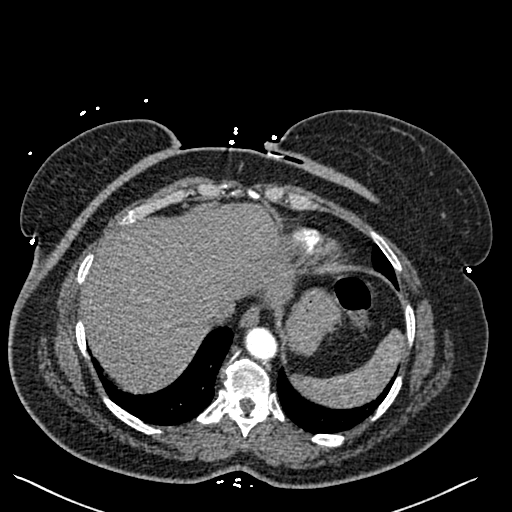
[im 73/246  lung]
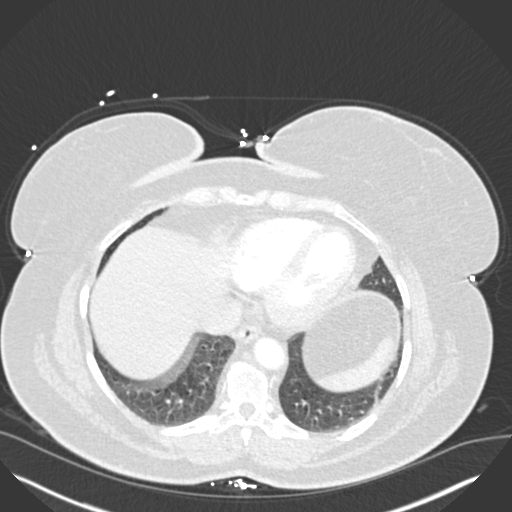
[im 87/246  soft-tissue]
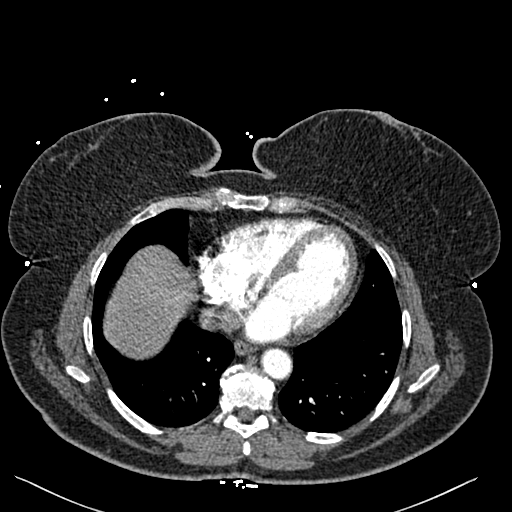
[im 101/246  lung]
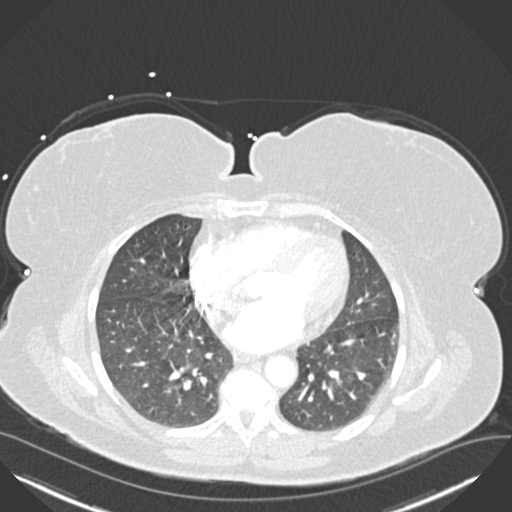
[im 116/246  soft-tissue]
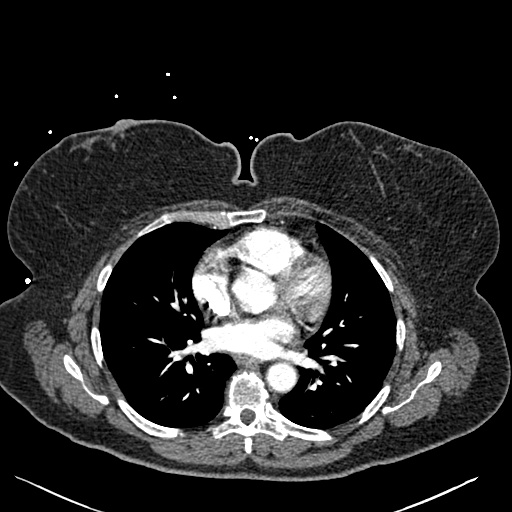
[im 130/246  lung]
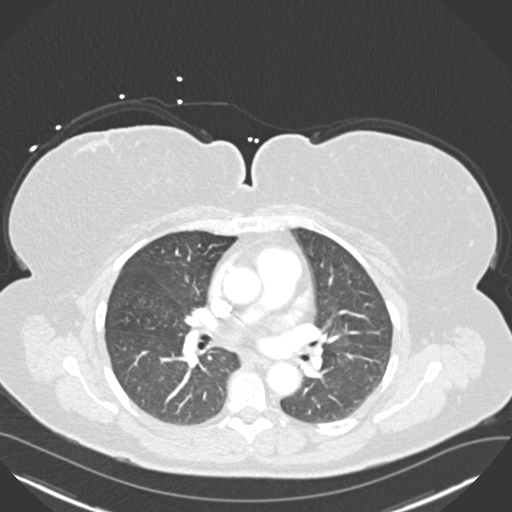
[im 145/246  soft-tissue]
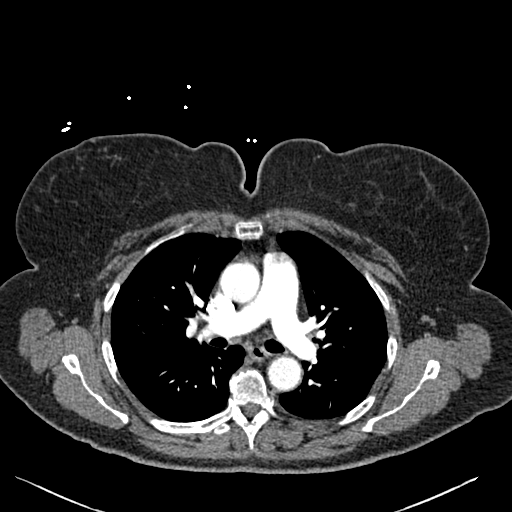
[im 159/246  lung]
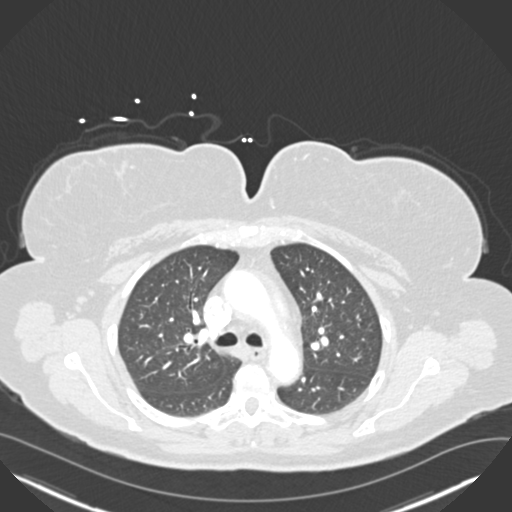
[im 173/246  soft-tissue]
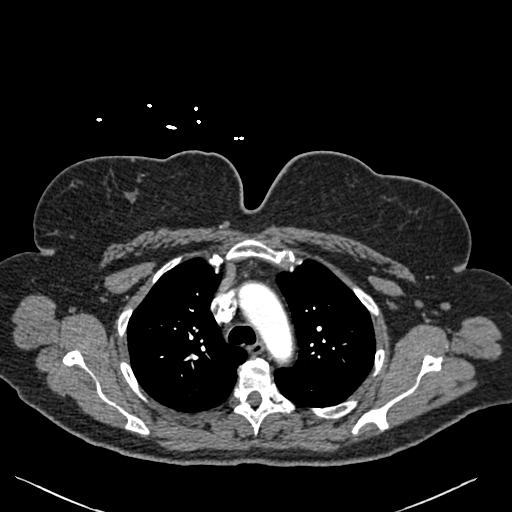
[im 188/246  lung]
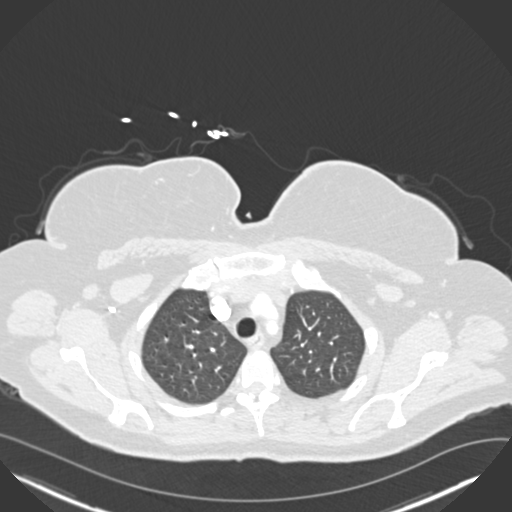
[im 202/246  soft-tissue]
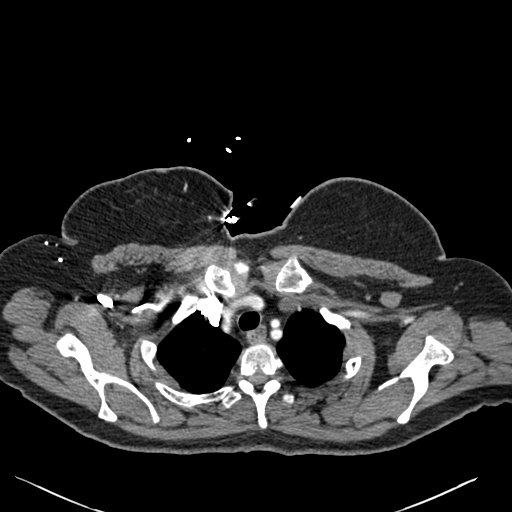
[im 217/246  lung]
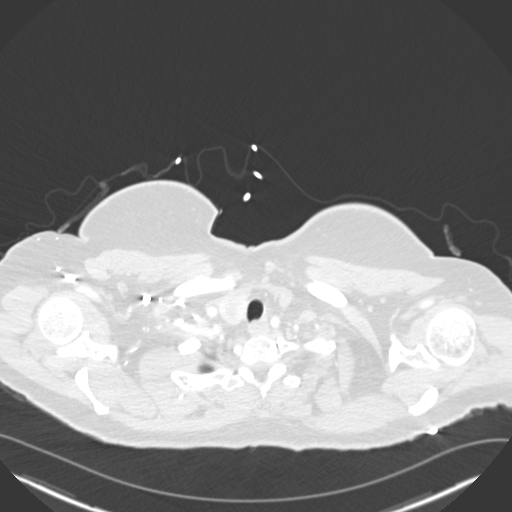
[im 231/246  soft-tissue]
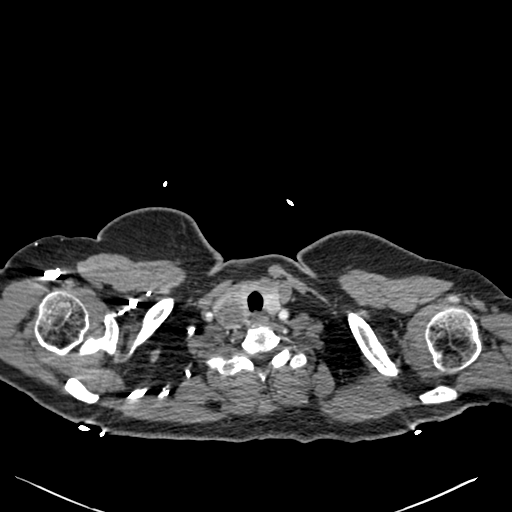

[Series 7: coronal mpr · coronal · 0.50mm/px · 2 of 84 slices shown]
[im 28/84  soft-tissue]
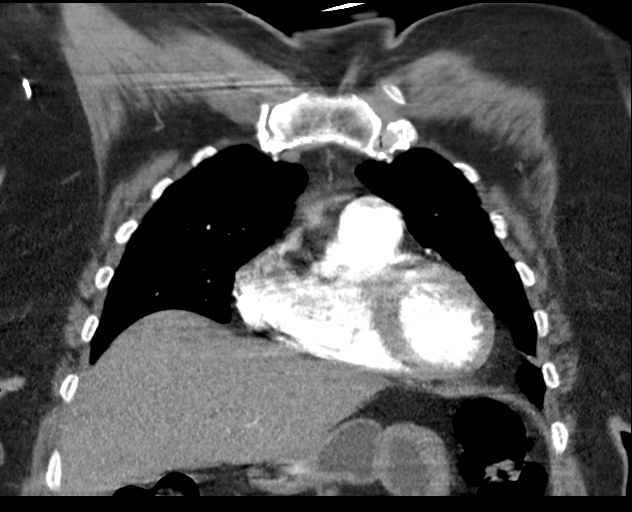
[im 56/84  soft-tissue]
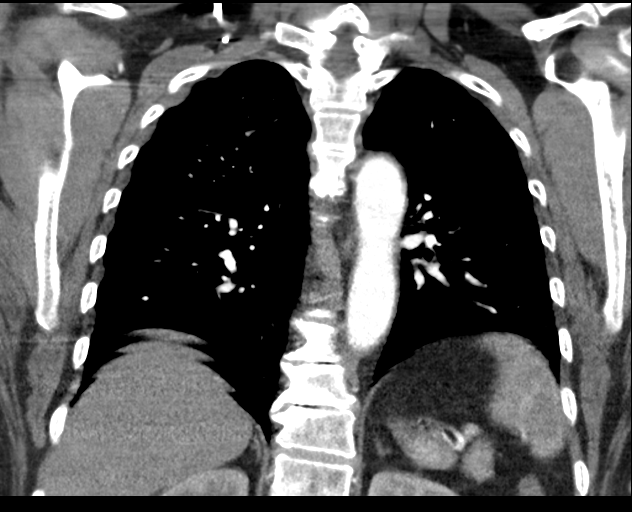

[18 of 46 positions shown; findings below may reference images not displayed]

FINDINGS: Cardiovascular: Good contrast bolus timing in the pulmonary arterial
tree. Mild lower lobe respiratory motion especially in the right
lower lobe posterior basal segment.

No focal filling defect identified in the pulmonary arteries to
suggest acute pulmonary embolism.

Cardiac size at the upper limits of normal. No pericardial effusion.
Negative visible aorta. No calcified coronary artery atherosclerosis
is evident.

Mediastinum/Nodes: 2 cm hypodense right thyroid nodule meets size
criteria for routine follow-up thyroid ultrasound. No mediastinal
lymphadenopathy.

Lungs/Pleura: The major airways are patent. Mildly low lung volumes.
Minor atelectasis and respiratory motion. No pleural effusion or
other pulmonary abnormality.

Upper Abdomen: Negative visible liver, gallbladder, spleen,
pancreas, adrenal glands, kidneys and bowel in the upper abdomen.

Musculoskeletal: Degenerative changes in the spine. No acute osseous
abnormality identified.

Review of the MIP images confirms the above findings.
IMPRESSION: 1. Negative for acute pulmonary embolus. No acute findings in the
chest.

2. A 2 cm right thyroid nodule meets size criteria for routine
follow-up Thyroid Ultrasound.

## 2022-02-28 ENCOUNTER — Other Ambulatory Visit: Payer: Self-pay | Admitting: Family Medicine

## 2022-02-28 DIAGNOSIS — Z1231 Encounter for screening mammogram for malignant neoplasm of breast: Secondary | ICD-10-CM

## 2022-05-29 ENCOUNTER — Other Ambulatory Visit: Payer: Self-pay

## 2022-05-29 DIAGNOSIS — Z1231 Encounter for screening mammogram for malignant neoplasm of breast: Secondary | ICD-10-CM

## 2022-05-30 ENCOUNTER — Ambulatory Visit
Admission: RE | Admit: 2022-05-30 | Discharge: 2022-05-30 | Disposition: A | Discharge status: Home / Self Care | Payer: Self-pay | Source: Ambulatory Visit | Attending: Obstetrics and Gynecology | Admitting: Obstetrics and Gynecology

## 2022-05-30 ENCOUNTER — Ambulatory Visit: Payer: Self-pay | Attending: Hematology and Oncology | Admitting: Hematology and Oncology

## 2022-05-30 VITALS — BP 118/64 | Wt 160.0 lb

## 2022-05-30 DIAGNOSIS — Z1231 Encounter for screening mammogram for malignant neoplasm of breast: Secondary | ICD-10-CM

## 2022-05-30 NOTE — Patient Instructions (Signed)
[place] about self breast awareness. Patient did not need a Pap smear today due to last Pap smear was in 2023 per patient. Told patient about free cervical cancer screenings to receive a Pap smear if would like one next year. Let her know [facility] will cover Pap smears every 3 years unless has a history of abnormal Pap smears. [facility] Appointment scheduled for 05/30/22. Patient aware of appointment and will be there. Let patient know will follow up with her within the next couple weeks with results. Otilio Saber verbalized understanding. Mammogram due next year. Pap smear due in 2028/.  Melodye Ped, NP 2:33 PM

## 2022-05-30 NOTE — Progress Notes (Signed)
[name] is a 61 y.o. female who presents to [facility] clinic today with no complaints.    Pap Smear: Pap not smear completed today. Last Pap smear was 2023 at [facility] clinic and was normal. Per patient has no history of an abnormal Pap smear. Last Pap smear result is not available in Epic.   Physical exam: Breasts Breasts symmetrical. No skin abnormalities bilateral breasts. No nipple retraction bilateral breasts. No nipple discharge bilateral breasts. No lymphadenopathy. No lumps palpated bilateral breasts.        Pelvic/Bimanual Pap is not indicated today    Smoking History: Patient has is a former smoker having quit at age 61 and was not referred to quit line.    Patient Navigation: Patient education provided. Access to services provided for patient through [facility] program. No interpreter provided. No transportation provided   Colorectal Cancer Screening: Per patient has never had colonoscopy completed No complaints today. FIT test negative this year per [facility]   Breast and Cervical Cancer Risk Assessment: Patient has family history of breast cancer, with her paternal aunt. Patient does not have history of cervical dysplasia, immunocompromised, or DES exposure in-utero.  Risk Assessment   No risk assessment data     A: [facility] exam without pap smear No complaints with benign exam.   P: [facility]. Appointment scheduled 05/30/22.  Mariah Ped, NP 05/30/2022 3:11 PM

## 2022-05-31 ENCOUNTER — Other Ambulatory Visit: Payer: Self-pay | Admitting: *Deleted

## 2022-05-31 ENCOUNTER — Inpatient Hospital Stay
Admission: RE | Admit: 2022-05-31 | Discharge: 2022-05-31 | Disposition: A | Discharge status: Home / Self Care | Payer: Self-pay | Source: Ambulatory Visit | Attending: *Deleted | Admitting: *Deleted

## 2022-05-31 DIAGNOSIS — Z1231 Encounter for screening mammogram for malignant neoplasm of breast: Secondary | ICD-10-CM

## 2023-07-06 ENCOUNTER — Other Ambulatory Visit: Payer: Self-pay

## 2023-07-06 DIAGNOSIS — Z1231 Encounter for screening mammogram for malignant neoplasm of breast: Secondary | ICD-10-CM

## 2023-07-30 ENCOUNTER — Ambulatory Visit: Payer: Self-pay | Attending: Hematology and Oncology | Admitting: *Deleted

## 2023-07-30 ENCOUNTER — Ambulatory Visit
Admission: RE | Admit: 2023-07-30 | Discharge: 2023-07-30 | Disposition: A | Discharge status: Home / Self Care | Payer: Self-pay | Source: Ambulatory Visit | Attending: Obstetrics and Gynecology | Admitting: Obstetrics and Gynecology

## 2023-07-30 VITALS — BP 113/71 | Wt 160.3 lb

## 2023-07-30 DIAGNOSIS — Z1231 Encounter for screening mammogram for malignant neoplasm of breast: Secondary | ICD-10-CM | POA: Insufficient documentation

## 2023-07-30 DIAGNOSIS — Z1239 Encounter for other screening for malignant neoplasm of breast: Secondary | ICD-10-CM

## 2023-07-30 NOTE — Patient Instructions (Signed)
Explained breast self awareness with Quinn Plowman. Patient did not need a Pap smear today due to last Pap smear and HPV typing was 03/28/2022. Let her know [facility] will cover Pap smears and HPV typing every 5 years unless has a history of abnormal Pap smears. Referred patient to the [facility] for a screening mammogram. Appointment scheduled Monday, July 30, 2023 at 1620. Patient aware of appointment and will be there. Let patient know Delford Field will follow up with her within the next couple weeks with results of mammogram by letter or phone. Quinn Plowman verbalized understanding.  Keanu Lesniak, Kathaleen Maser, RN 3:20 PM

## 2023-07-30 NOTE — Progress Notes (Signed)
Ms. NIMISHA RATHEL is a 63 y.o. female who presents to [facility] clinic today with no complaints.    Pap Smear: Pap smear not completed today. Last Pap smear was 03/28/2022 at [facility] clinic and was normal with negative HPV. Per patient has history of an abnormal Pap smear when she was 63 years old that cryotherapy was completed for follow up. Patient stated that she has had more than three normal Pap smears since cryotherapy. Last Pap smear result is available in Epic.   Physical exam: Breasts Breasts symmetrical. No skin abnormalities bilateral breasts. No nipple retraction bilateral breasts. No nipple discharge bilateral breasts. No lymphadenopathy. No lumps palpated bilateral breasts. No complaints of pain or tenderness on exam.     MS DIGITAL SCREENING TOMO BILATERAL Result Date: 05/31/2022 CLINICAL DATA:  Screening. EXAM: DIGITAL SCREENING BILATERAL MAMMOGRAM WITH TOMOSYNTHESIS AND CAD TECHNIQUE: Bilateral screening digital craniocaudal and mediolateral oblique mammograms were obtained. Bilateral screening digital breast tomosynthesis was performed. The images were evaluated with computer-aided detection. COMPARISON:  Previous exam 08/26/2014 and earlier from [facility]. ACR Breast Density Category b: There are scattered areas of fibroglandular density. FINDINGS: There are no findings suspicious for malignancy. IMPRESSION: No mammographic evidence of malignancy. A result letter of this screening mammogram will be mailed directly to the patient. RECOMMENDATION: Screening mammogram in one year. (Code:SM-B-01Y) BI-RADS CATEGORY  1: Negative. Electronically Signed   By: Hulan Saas M.D.   On: 05/31/2022 13:24   Pelvic/Bimanual Pap is not indicated today per [facility]CP guidelines.   Smoking History: Patient is a former smoker that smoked for one year when she was 63 years old and quit.   Patient Navigation: Patient education provided. Access to services provided for patient through [facility] program.    Colorectal Cancer Screening: Per patient has never had colonoscopy completed. Per patient completed a cologuard test in December 2024 given by her PCP at [facility] and was negative. No complaints today.    Breast and Cervical Cancer Risk Assessment: Patient has family history of a paternal aunt having breast cancer. Patient has no known genetic mutations or history of radiation treatment to the chest before age 74. Per patient has history of cervical dysplasia. Patient has no history of being immunocompromised or DES exposure in-utero.  Risk Scores as of Encounter on 07/30/2023     Dondra Spry           5-year 1.25%   Lifetime 4.75%            Last calculated by Narda Rutherford, LPN on 1/61/0960 at  3:32 PM       A: [facility] exam without pap smear No complaints.  P: Referred patient to the [facility] for a screening mammogram. Appointment scheduled Monday, July 30, 2023 at 1620.  Priscille Heidelberg, RN 07/30/2023 3:20 PM

## 2024-07-08 ENCOUNTER — Other Ambulatory Visit: Payer: Self-pay | Admitting: Family Medicine

## 2024-07-08 ENCOUNTER — Encounter: Payer: Self-pay | Admitting: Family Medicine

## 2024-07-08 DIAGNOSIS — Z1231 Encounter for screening mammogram for malignant neoplasm of breast: Secondary | ICD-10-CM
